# Patient Record
Sex: Male | Born: 1950 | Race: Black or African American | Hispanic: No | Marital: Married | Smoking: Current every day smoker
Health system: Southern US, Community
[De-identification: ages and names within clinical notes are randomized; demographics above are authoritative.]

## PROBLEM LIST (undated history)

## (undated) DIAGNOSIS — I639 Cerebral infarction, unspecified: Secondary | ICD-10-CM

## (undated) DIAGNOSIS — K861 Other chronic pancreatitis: Secondary | ICD-10-CM

## (undated) DIAGNOSIS — I10 Essential (primary) hypertension: Secondary | ICD-10-CM

## (undated) DIAGNOSIS — F0390 Unspecified dementia without behavioral disturbance: Secondary | ICD-10-CM

## (undated) DIAGNOSIS — R911 Solitary pulmonary nodule: Secondary | ICD-10-CM

## (undated) DIAGNOSIS — G40909 Epilepsy, unspecified, not intractable, without status epilepticus: Secondary | ICD-10-CM

## (undated) DIAGNOSIS — K2210 Ulcer of esophagus without bleeding: Secondary | ICD-10-CM

## (undated) DIAGNOSIS — F1010 Alcohol abuse, uncomplicated: Secondary | ICD-10-CM

## (undated) DIAGNOSIS — K219 Gastro-esophageal reflux disease without esophagitis: Secondary | ICD-10-CM

## (undated) HISTORY — DX: Alcohol abuse, uncomplicated: F10.10

## (undated) HISTORY — PX: NO PAST SURGERIES: SHX2092

## (undated) HISTORY — DX: Cerebral infarction, unspecified: I63.9

## (undated) HISTORY — DX: Gastro-esophageal reflux disease without esophagitis: K21.9

## (undated) HISTORY — DX: Epilepsy, unspecified, not intractable, without status epilepticus: G40.909

## (undated) HISTORY — DX: Unspecified dementia, unspecified severity, without behavioral disturbance, psychotic disturbance, mood disturbance, and anxiety: F03.90

## (undated) HISTORY — PX: OTHER SURGICAL HISTORY: SHX169

## (undated) HISTORY — DX: Essential (primary) hypertension: I10

---

## 1999-03-28 DIAGNOSIS — G40909 Epilepsy, unspecified, not intractable, without status epilepticus: Secondary | ICD-10-CM

## 1999-03-28 HISTORY — DX: Epilepsy, unspecified, not intractable, without status epilepticus: G40.909

## 1999-08-01 ENCOUNTER — Inpatient Hospital Stay (HOSPITAL_COMMUNITY): Admission: EM | Admit: 1999-08-01 | Discharge: 1999-08-16 | Payer: Self-pay | Admitting: Emergency Medicine

## 1999-08-02 ENCOUNTER — Encounter: Payer: Self-pay | Admitting: Neurosurgery

## 1999-08-04 ENCOUNTER — Encounter: Payer: Self-pay | Admitting: Neurosurgery

## 2003-11-14 ENCOUNTER — Other Ambulatory Visit: Payer: Self-pay

## 2004-03-05 ENCOUNTER — Emergency Department: Payer: Self-pay | Admitting: General Practice

## 2006-01-12 ENCOUNTER — Emergency Department: Payer: Self-pay | Admitting: Emergency Medicine

## 2007-03-15 ENCOUNTER — Emergency Department (HOSPITAL_COMMUNITY): Admission: EM | Admit: 2007-03-15 | Discharge: 2007-03-15 | Payer: Self-pay | Admitting: Emergency Medicine

## 2008-08-21 ENCOUNTER — Inpatient Hospital Stay (HOSPITAL_COMMUNITY): Admission: EM | Admit: 2008-08-21 | Discharge: 2008-08-26 | Payer: Self-pay | Admitting: Emergency Medicine

## 2008-09-05 ENCOUNTER — Inpatient Hospital Stay (HOSPITAL_COMMUNITY): Admission: EM | Admit: 2008-09-05 | Discharge: 2008-09-11 | Payer: Self-pay | Admitting: Internal Medicine

## 2008-09-05 ENCOUNTER — Emergency Department: Payer: Self-pay | Admitting: Emergency Medicine

## 2009-04-07 ENCOUNTER — Inpatient Hospital Stay (HOSPITAL_COMMUNITY): Admission: EM | Admit: 2009-04-07 | Discharge: 2009-04-15 | Payer: Self-pay | Admitting: Emergency Medicine

## 2009-07-18 ENCOUNTER — Observation Stay (HOSPITAL_COMMUNITY): Admission: EM | Admit: 2009-07-18 | Discharge: 2009-07-20 | Payer: Self-pay | Admitting: Emergency Medicine

## 2009-07-18 ENCOUNTER — Ambulatory Visit: Payer: Self-pay | Admitting: Family Medicine

## 2009-07-28 ENCOUNTER — Ambulatory Visit (HOSPITAL_COMMUNITY): Admission: RE | Admit: 2009-07-28 | Discharge: 2009-07-28 | Payer: Self-pay | Admitting: Internal Medicine

## 2009-09-04 ENCOUNTER — Observation Stay (HOSPITAL_COMMUNITY): Admission: EM | Admit: 2009-09-04 | Discharge: 2009-09-05 | Payer: Self-pay | Admitting: Emergency Medicine

## 2009-09-14 ENCOUNTER — Emergency Department (HOSPITAL_COMMUNITY): Admission: EM | Admit: 2009-09-14 | Discharge: 2009-09-14 | Payer: Self-pay | Admitting: Emergency Medicine

## 2009-10-18 ENCOUNTER — Emergency Department (HOSPITAL_COMMUNITY): Admission: EM | Admit: 2009-10-18 | Discharge: 2009-10-19 | Payer: Self-pay | Admitting: Emergency Medicine

## 2010-04-26 ENCOUNTER — Inpatient Hospital Stay (HOSPITAL_COMMUNITY)
Admission: EM | Admit: 2010-04-26 | Discharge: 2010-05-16 | DRG: 101 | Disposition: A | Discharge status: Skilled Nursing Facility | Payer: Medicare Other | Attending: Internal Medicine | Admitting: Internal Medicine

## 2010-04-26 DIAGNOSIS — F1020 Alcohol dependence, uncomplicated: Secondary | ICD-10-CM | POA: Diagnosis present

## 2010-04-26 DIAGNOSIS — K861 Other chronic pancreatitis: Secondary | ICD-10-CM | POA: Diagnosis present

## 2010-04-26 DIAGNOSIS — K709 Alcoholic liver disease, unspecified: Secondary | ICD-10-CM | POA: Diagnosis present

## 2010-04-26 DIAGNOSIS — F17200 Nicotine dependence, unspecified, uncomplicated: Secondary | ICD-10-CM | POA: Diagnosis present

## 2010-04-26 DIAGNOSIS — N179 Acute kidney failure, unspecified: Secondary | ICD-10-CM | POA: Diagnosis present

## 2010-04-26 DIAGNOSIS — R5381 Other malaise: Secondary | ICD-10-CM | POA: Diagnosis present

## 2010-04-26 DIAGNOSIS — K449 Diaphragmatic hernia without obstruction or gangrene: Secondary | ICD-10-CM | POA: Diagnosis present

## 2010-04-26 DIAGNOSIS — E876 Hypokalemia: Secondary | ICD-10-CM | POA: Diagnosis present

## 2010-04-26 DIAGNOSIS — E119 Type 2 diabetes mellitus without complications: Secondary | ICD-10-CM | POA: Diagnosis present

## 2010-04-26 DIAGNOSIS — Z9119 Patient's noncompliance with other medical treatment and regimen: Secondary | ICD-10-CM

## 2010-04-26 DIAGNOSIS — K2100 Gastro-esophageal reflux disease with esophagitis, without bleeding: Secondary | ICD-10-CM | POA: Diagnosis present

## 2010-04-26 DIAGNOSIS — R112 Nausea with vomiting, unspecified: Secondary | ICD-10-CM | POA: Diagnosis present

## 2010-04-26 DIAGNOSIS — F10939 Alcohol use, unspecified with withdrawal, unspecified: Secondary | ICD-10-CM | POA: Diagnosis present

## 2010-04-26 DIAGNOSIS — I69991 Dysphagia following unspecified cerebrovascular disease: Secondary | ICD-10-CM

## 2010-04-26 DIAGNOSIS — K2970 Gastritis, unspecified, without bleeding: Secondary | ICD-10-CM | POA: Diagnosis present

## 2010-04-26 DIAGNOSIS — F10239 Alcohol dependence with withdrawal, unspecified: Secondary | ICD-10-CM | POA: Diagnosis present

## 2010-04-26 DIAGNOSIS — R195 Other fecal abnormalities: Secondary | ICD-10-CM | POA: Diagnosis present

## 2010-04-26 DIAGNOSIS — I10 Essential (primary) hypertension: Secondary | ICD-10-CM | POA: Diagnosis present

## 2010-04-26 DIAGNOSIS — K560 Paralytic ileus: Secondary | ICD-10-CM | POA: Diagnosis present

## 2010-04-26 DIAGNOSIS — R1110 Vomiting, unspecified: Secondary | ICD-10-CM

## 2010-04-26 DIAGNOSIS — R911 Solitary pulmonary nodule: Secondary | ICD-10-CM | POA: Diagnosis present

## 2010-04-26 DIAGNOSIS — F1027 Alcohol dependence with alcohol-induced persisting dementia: Secondary | ICD-10-CM | POA: Diagnosis present

## 2010-04-26 DIAGNOSIS — K269 Duodenal ulcer, unspecified as acute or chronic, without hemorrhage or perforation: Secondary | ICD-10-CM | POA: Diagnosis present

## 2010-04-26 DIAGNOSIS — Z91199 Patient's noncompliance with other medical treatment and regimen due to unspecified reason: Secondary | ICD-10-CM

## 2010-04-26 DIAGNOSIS — R1312 Dysphagia, oropharyngeal phase: Secondary | ICD-10-CM | POA: Diagnosis present

## 2010-04-26 DIAGNOSIS — G40909 Epilepsy, unspecified, not intractable, without status epilepticus: Principal | ICD-10-CM | POA: Diagnosis present

## 2010-04-26 LAB — COMPREHENSIVE METABOLIC PANEL
ALT: 19 U/L (ref 0–53)
AST: 18 U/L (ref 0–37)
AST: 18 U/L (ref 0–37)
Albumin: 3.6 g/dL (ref 3.5–5.2)
Alkaline Phosphatase: 70 U/L (ref 39–117)
CO2: 24 mEq/L (ref 19–32)
Calcium: 8.6 mg/dL (ref 8.4–10.5)
Calcium: 8.5 mg/dL (ref 8.4–10.5)
Chloride: 107 mEq/L (ref 96–112)
GFR calc Af Amer: >60 mL/min (ref >60)
GFR calc Af Amer: >60 mL/min (ref >60)
GFR calc non Af Amer: >60 mL/min (ref >60)
Glucose, Bld: 187 mg/dL — ABNORMAL HIGH (ref 70–99)
Glucose, Bld: 150 mg/dL — ABNORMAL HIGH (ref 70–99)
Potassium: 3.5 mEq/L (ref 3.5–5.1)
Potassium: 3.4 mEq/L — ABNORMAL LOW (ref 3.5–5.1)
Sodium: 139 mEq/L (ref 135–145)
Sodium: 138 mEq/L (ref 135–145)
Total Protein: 6.8 g/dL (ref 6.0–8.3)
Total Protein: 6.6 g/dL (ref 6.0–8.3)

## 2010-04-26 LAB — POCT I-STAT, CHEM 8
BUN: 18 mg/dL (ref 6–23)
Calcium, Ion: 1.13 mmol/L (ref 1.12–1.32)
Glucose, Bld: 188 mg/dL — ABNORMAL HIGH (ref 70–99)
Potassium: 3.7 mEq/L (ref 3.5–5.1)
Sodium: 140 mEq/L (ref 135–145)

## 2010-04-26 LAB — DIFFERENTIAL
Basophils Absolute: 0 10*3/uL (ref 0.0–0.1)
Basophils Relative: 0 % (ref 0–1)
Basophils Relative: 0 % (ref 0–1)
Eosinophils Absolute: 0 10*3/uL (ref 0.0–0.7)
Eosinophils Absolute: 0 10*3/uL (ref 0.0–0.7)
Eosinophils Relative: 0 % (ref 0–5)
Lymphocytes Relative: 7 % — ABNORMAL LOW (ref 12–46)
Lymphs Abs: 1.4 10*3/uL (ref 0.7–4.0)
Monocytes Absolute: 0.3 10*3/uL (ref 0.1–1.0)
Monocytes Absolute: 0.8 10*3/uL (ref 0.1–1.0)
Monocytes Relative: 9 % (ref 3–12)

## 2010-04-26 LAB — CBC
HCT: 42.4 % (ref 39.0–52.0)
Hemoglobin: 14.2 g/dL (ref 13.0–17.0)
MCH: 29.6 pg (ref 26.0–34.0)
MCHC: 34.5 g/dL (ref 30.0–36.0)
MCV: 85.8 fL (ref 78.0–100.0)
Platelets: 125 10*3/uL — ABNORMAL LOW (ref 150–400)
Platelets: 145 10*3/uL — ABNORMAL LOW (ref 150–400)
RBC: 4.91 MIL/uL (ref 4.22–5.81)
RBC: 4.59 MIL/uL (ref 4.22–5.81)
RDW: 14.2 % (ref 11.5–15.5)
RDW: 14.1 % (ref 11.5–15.5)
WBC: 7.3 10*3/uL (ref 4.0–10.5)

## 2010-04-26 LAB — RAPID URINE DRUG SCREEN, HOSP PERFORMED: Benzodiazepines: POSITIVE — AB

## 2010-04-26 LAB — MRSA PCR SCREENING: MRSA by PCR: INVALID — AB

## 2010-04-26 LAB — GLUCOSE, CAPILLARY: Glucose-Capillary: 132 mg/dL — ABNORMAL HIGH (ref 70–99)

## 2010-04-26 LAB — PHOSPHORUS: Phosphorus: 2 mg/dL — ABNORMAL LOW (ref 2.3–4.6)

## 2010-04-27 LAB — BASIC METABOLIC PANEL
BUN: 8 mg/dL (ref 6–23)
Chloride: 106 mEq/L (ref 96–112)
Creatinine, Ser: 0.92 mg/dL (ref 0.4–1.5)
GFR calc non Af Amer: >60 mL/min (ref >60)
Glucose, Bld: 152 mg/dL — ABNORMAL HIGH (ref 70–99)
Potassium: 3.4 mEq/L — ABNORMAL LOW (ref 3.5–5.1)

## 2010-04-27 LAB — GLUCOSE, CAPILLARY
Glucose-Capillary: 136 mg/dL — ABNORMAL HIGH (ref 70–99)
Glucose-Capillary: 131 mg/dL — ABNORMAL HIGH (ref 70–99)
Glucose-Capillary: 112 mg/dL — ABNORMAL HIGH (ref 70–99)

## 2010-04-27 LAB — CBC
HCT: 39.9 % (ref 39.0–52.0)
MCH: 28.4 pg (ref 26.0–34.0)
MCV: 86.4 fL (ref 78.0–100.0)
RDW: 14.1 % (ref 11.5–15.5)
WBC: 7 10*3/uL (ref 4.0–10.5)

## 2010-04-27 LAB — HEMOGLOBIN A1C: Hgb A1c MFr Bld: 6.7 % — ABNORMAL HIGH (ref <5.7)

## 2010-04-27 LAB — VALPROIC ACID LEVEL: Valproic Acid Lvl: <1 ug/mL — ABNORMAL LOW (ref 50.0–100.0)

## 2010-04-28 LAB — BASIC METABOLIC PANEL
BUN: 8 mg/dL (ref 6–23)
CO2: 25 mEq/L (ref 19–32)
Chloride: 101 mEq/L (ref 96–112)
Glucose, Bld: 149 mg/dL — ABNORMAL HIGH (ref 70–99)
Potassium: 3.4 mEq/L — ABNORMAL LOW (ref 3.5–5.1)

## 2010-04-28 LAB — GLUCOSE, CAPILLARY
Glucose-Capillary: 140 mg/dL — ABNORMAL HIGH (ref 70–99)
Glucose-Capillary: 184 mg/dL — ABNORMAL HIGH (ref 70–99)

## 2010-04-28 LAB — CBC
HCT: 40.3 % (ref 39.0–52.0)
MCV: 85.4 fL (ref 78.0–100.0)
RBC: 4.72 MIL/uL (ref 4.22–5.81)
WBC: 6.9 10*3/uL (ref 4.0–10.5)

## 2010-04-29 LAB — GLUCOSE, CAPILLARY
Glucose-Capillary: 141 mg/dL — ABNORMAL HIGH (ref 70–99)
Glucose-Capillary: 133 mg/dL — ABNORMAL HIGH (ref 70–99)

## 2010-04-29 LAB — MRSA CULTURE

## 2010-04-30 LAB — CBC
MCH: 28.9 pg (ref 26.0–34.0)
MCHC: 33.2 g/dL (ref 30.0–36.0)
MCV: 87 fL (ref 78.0–100.0)
Platelets: 129 10*3/uL — ABNORMAL LOW (ref 150–400)
RDW: 14.5 % (ref 11.5–15.5)
WBC: 7.8 10*3/uL (ref 4.0–10.5)

## 2010-04-30 LAB — GLUCOSE, CAPILLARY: Glucose-Capillary: 157 mg/dL — ABNORMAL HIGH (ref 70–99)

## 2010-04-30 LAB — BASIC METABOLIC PANEL
BUN: 15 mg/dL (ref 6–23)
Calcium: 8.2 mg/dL — ABNORMAL LOW (ref 8.4–10.5)
Creatinine, Ser: 0.88 mg/dL (ref 0.4–1.5)
GFR calc non Af Amer: >60 mL/min (ref >60)
Potassium: 3.6 mEq/L (ref 3.5–5.1)

## 2010-04-30 LAB — LIPASE, BLOOD: Lipase: 32 U/L (ref 11–59)

## 2010-05-01 ENCOUNTER — Inpatient Hospital Stay (HOSPITAL_COMMUNITY): Payer: Medicare Other

## 2010-05-01 LAB — GLUCOSE, CAPILLARY: Glucose-Capillary: 174 mg/dL — ABNORMAL HIGH (ref 70–99)

## 2010-05-01 LAB — URINALYSIS, ROUTINE W REFLEX MICROSCOPIC
Leukocytes, UA: NEGATIVE
Specific Gravity, Urine: >1.046 — ABNORMAL HIGH (ref 1.005–1.030)
Urine Glucose, Fasting: NEGATIVE mg/dL

## 2010-05-01 LAB — URINE MICROSCOPIC-ADD ON

## 2010-05-01 MED ORDER — IOHEXOL 300 MG/ML  SOLN: 125.0000 mL | Freq: Once | INTRAMUSCULAR | Status: AC | PRN

## 2010-05-02 LAB — HEPATIC FUNCTION PANEL
AST: 17 U/L (ref 0–37)
Albumin: 3.4 g/dL — ABNORMAL LOW (ref 3.5–5.2)
Total Bilirubin: 0.7 mg/dL (ref 0.3–1.2)
Total Protein: 6.5 g/dL (ref 6.0–8.3)

## 2010-05-02 LAB — CBC
Hemoglobin: 13.6 g/dL (ref 13.0–17.0)
MCHC: 33.3 g/dL (ref 30.0–36.0)
Platelets: 153 10*3/uL (ref 150–400)
RBC: 4.67 MIL/uL (ref 4.22–5.81)

## 2010-05-02 LAB — GLUCOSE, CAPILLARY: Glucose-Capillary: 125 mg/dL — ABNORMAL HIGH (ref 70–99)

## 2010-05-02 LAB — BASIC METABOLIC PANEL
CO2: 19 mEq/L (ref 19–32)
Calcium: 8.8 mg/dL (ref 8.4–10.5)
Chloride: 113 mEq/L — ABNORMAL HIGH (ref 96–112)
Creatinine, Ser: 0.73 mg/dL (ref 0.4–1.5)
GFR calc Af Amer: >60 mL/min (ref >60)
Sodium: 140 mEq/L (ref 135–145)

## 2010-05-03 ENCOUNTER — Inpatient Hospital Stay (HOSPITAL_COMMUNITY): Payer: Medicare Other

## 2010-05-03 LAB — BASIC METABOLIC PANEL
BUN: 12 mg/dL (ref 6–23)
CO2: 22 mEq/L (ref 19–32)
Chloride: 107 mEq/L (ref 96–112)
Glucose, Bld: 135 mg/dL — ABNORMAL HIGH (ref 70–99)
Potassium: 3.6 mEq/L (ref 3.5–5.1)

## 2010-05-03 LAB — CULTURE, BLOOD (ROUTINE X 2)
Culture  Setup Time: 201202010041
Culture: NO GROWTH

## 2010-05-03 LAB — GLUCOSE, CAPILLARY
Glucose-Capillary: 137 mg/dL — ABNORMAL HIGH (ref 70–99)
Glucose-Capillary: 131 mg/dL — ABNORMAL HIGH (ref 70–99)

## 2010-05-03 LAB — URINE CULTURE: Colony Count: 55000

## 2010-05-03 LAB — CBC
HCT: 40.6 % (ref 39.0–52.0)
Hemoglobin: 13.8 g/dL (ref 13.0–17.0)
MCV: 86.2 fL (ref 78.0–100.0)
RBC: 4.71 MIL/uL (ref 4.22–5.81)
RDW: 14.1 % (ref 11.5–15.5)
WBC: 6.7 10*3/uL (ref 4.0–10.5)

## 2010-05-04 ENCOUNTER — Inpatient Hospital Stay (HOSPITAL_COMMUNITY): Payer: Medicare Other

## 2010-05-04 LAB — HEPATIC FUNCTION PANEL
ALT: 24 U/L (ref 0–53)
Albumin: 3.7 g/dL (ref 3.5–5.2)
Alkaline Phosphatase: 77 U/L (ref 39–117)
Total Protein: 7.2 g/dL (ref 6.0–8.3)

## 2010-05-04 LAB — BASIC METABOLIC PANEL
BUN: 23 mg/dL (ref 6–23)
CO2: 21 mEq/L (ref 19–32)
Chloride: 108 mEq/L (ref 96–112)
Creatinine, Ser: 0.83 mg/dL (ref 0.4–1.5)
Potassium: 3.9 mEq/L (ref 3.5–5.1)

## 2010-05-04 LAB — GLUCOSE, CAPILLARY
Glucose-Capillary: 143 mg/dL — ABNORMAL HIGH (ref 70–99)
Glucose-Capillary: 184 mg/dL — ABNORMAL HIGH (ref 70–99)
Glucose-Capillary: 164 mg/dL — ABNORMAL HIGH (ref 70–99)
Glucose-Capillary: 188 mg/dL — ABNORMAL HIGH (ref 70–99)

## 2010-05-05 ENCOUNTER — Inpatient Hospital Stay (HOSPITAL_COMMUNITY): Payer: Medicare Other

## 2010-05-05 LAB — GLUCOSE, CAPILLARY
Glucose-Capillary: 149 mg/dL — ABNORMAL HIGH (ref 70–99)
Glucose-Capillary: 153 mg/dL — ABNORMAL HIGH (ref 70–99)
Glucose-Capillary: 132 mg/dL — ABNORMAL HIGH (ref 70–99)
Glucose-Capillary: 200 mg/dL — ABNORMAL HIGH (ref 70–99)

## 2010-05-05 LAB — URINALYSIS, ROUTINE W REFLEX MICROSCOPIC
Nitrite: NEGATIVE
Protein, ur: 30 mg/dL — AB
Urine Glucose, Fasting: NEGATIVE mg/dL
Urobilinogen, UA: 0.2 mg/dL (ref 0.0–1.0)

## 2010-05-05 LAB — COMPREHENSIVE METABOLIC PANEL
ALT: 17 U/L (ref 0–53)
AST: 14 U/L (ref 0–37)
Alkaline Phosphatase: 69 U/L (ref 39–117)
CO2: 24 mEq/L (ref 19–32)
Calcium: 9.1 mg/dL (ref 8.4–10.5)
Chloride: 109 mEq/L (ref 96–112)
GFR calc Af Amer: >60 mL/min (ref >60)
GFR calc non Af Amer: >60 mL/min (ref >60)
Glucose, Bld: 152 mg/dL — ABNORMAL HIGH (ref 70–99)
Sodium: 145 mEq/L (ref 135–145)
Total Bilirubin: 0.8 mg/dL (ref 0.3–1.2)

## 2010-05-05 LAB — CBC
Hemoglobin: 15.3 g/dL (ref 13.0–17.0)
MCH: 29.4 pg (ref 26.0–34.0)
MCHC: 34 g/dL (ref 30.0–36.0)
Platelets: 269 10*3/uL (ref 150–400)
RDW: 14.5 % (ref 11.5–15.5)

## 2010-05-05 LAB — DIFFERENTIAL
Basophils Absolute: 0 10*3/uL (ref 0.0–0.1)
Basophils Relative: 0 % (ref 0–1)
Eosinophils Absolute: 0 10*3/uL (ref 0.0–0.7)
Lymphocytes Relative: 13 % (ref 12–46)
Lymphs Abs: 2.1 10*3/uL (ref 0.7–4.0)
Monocytes Absolute: 1.1 10*3/uL — ABNORMAL HIGH (ref 0.1–1.0)
Neutro Abs: 13.1 10*3/uL — ABNORMAL HIGH (ref 1.7–7.7)

## 2010-05-05 LAB — URINE MICROSCOPIC-ADD ON

## 2010-05-05 LAB — MAGNESIUM: Magnesium: 2.2 mg/dL (ref 1.5–2.5)

## 2010-05-06 LAB — CBC
Platelets: 249 10*3/uL (ref 150–400)
RBC: 5.05 MIL/uL (ref 4.22–5.81)
RDW: 14.7 % (ref 11.5–15.5)
WBC: 16.4 10*3/uL — ABNORMAL HIGH (ref 4.0–10.5)

## 2010-05-06 LAB — DIFFERENTIAL
Basophils Absolute: 0 10*3/uL (ref 0.0–0.1)
Basophils Relative: 0 % (ref 0–1)
Eosinophils Absolute: 0 10*3/uL (ref 0.0–0.7)
Eosinophils Relative: 0 % (ref 0–5)
Neutrophils Relative %: 80 % — ABNORMAL HIGH (ref 43–77)

## 2010-05-06 LAB — GLUCOSE, CAPILLARY
Glucose-Capillary: 162 mg/dL — ABNORMAL HIGH (ref 70–99)
Glucose-Capillary: 176 mg/dL — ABNORMAL HIGH (ref 70–99)
Glucose-Capillary: 160 mg/dL — ABNORMAL HIGH (ref 70–99)

## 2010-05-06 LAB — URINE CULTURE
Colony Count: NO GROWTH
Culture  Setup Time: 201202091417
Culture: NO GROWTH
Special Requests: NEGATIVE

## 2010-05-06 LAB — LIPASE, BLOOD: Lipase: 38 U/L (ref 11–59)

## 2010-05-06 LAB — COMPREHENSIVE METABOLIC PANEL
AST: 13 U/L (ref 0–37)
Albumin: 3.4 g/dL — ABNORMAL LOW (ref 3.5–5.2)
Alkaline Phosphatase: 70 U/L (ref 39–117)
BUN: 23 mg/dL (ref 6–23)
Chloride: 113 mEq/L — ABNORMAL HIGH (ref 96–112)
GFR calc Af Amer: >60 mL/min (ref >60)
Potassium: 3.6 mEq/L (ref 3.5–5.1)
Total Bilirubin: 0.4 mg/dL (ref 0.3–1.2)
Total Protein: 6.5 g/dL (ref 6.0–8.3)

## 2010-05-06 LAB — AMYLASE: Amylase: 37 U/L (ref 0–105)

## 2010-05-07 LAB — GLUCOSE, CAPILLARY
Glucose-Capillary: 136 mg/dL — ABNORMAL HIGH (ref 70–99)
Glucose-Capillary: 143 mg/dL — ABNORMAL HIGH (ref 70–99)
Glucose-Capillary: 144 mg/dL — ABNORMAL HIGH (ref 70–99)
Glucose-Capillary: 144 mg/dL — ABNORMAL HIGH (ref 70–99)

## 2010-05-07 LAB — COMPREHENSIVE METABOLIC PANEL
CO2: 25 mEq/L (ref 19–32)
Calcium: 8.7 mg/dL (ref 8.4–10.5)
Creatinine, Ser: 0.85 mg/dL (ref 0.4–1.5)
GFR calc Af Amer: >60 mL/min (ref >60)
GFR calc non Af Amer: >60 mL/min (ref >60)
Glucose, Bld: 152 mg/dL — ABNORMAL HIGH (ref 70–99)
Total Protein: 5.7 g/dL — ABNORMAL LOW (ref 6.0–8.3)

## 2010-05-07 LAB — CBC
HCT: 39 % (ref 39.0–52.0)
Hemoglobin: 13 g/dL (ref 13.0–17.0)
MCH: 29.3 pg (ref 26.0–34.0)
MCHC: 33.3 g/dL (ref 30.0–36.0)
RDW: 14.7 % (ref 11.5–15.5)

## 2010-05-07 LAB — DIFFERENTIAL
Basophils Relative: 0 % (ref 0–1)
Eosinophils Relative: 1 % (ref 0–5)
Monocytes Absolute: 1.1 10*3/uL — ABNORMAL HIGH (ref 0.1–1.0)
Monocytes Relative: 10 % (ref 3–12)
Neutro Abs: 9.1 10*3/uL — ABNORMAL HIGH (ref 1.7–7.7)

## 2010-05-07 LAB — MAGNESIUM: Magnesium: 2.1 mg/dL (ref 1.5–2.5)

## 2010-05-07 LAB — VALPROIC ACID LEVEL: Valproic Acid Lvl: <10 ug/mL — ABNORMAL LOW (ref 50.0–100.0)

## 2010-05-07 NOTE — Discharge Summary (Signed)
  NAME:  [name] [name], TIFT             ACCOUNT NO.:  [id number]  MEDICAL RECORD NO.:  [id number]           PATIENT TYPE:  I  LOCATION:  1240                         FACILITY:  [facility]  PHYSICIAN:  Talmage Nap, MD  DATE OF BIRTH:  [birth year]  DATE OF ADMISSION:  04/26/2010 DATE OF DISCHARGE:                        DISCHARGE SUMMARY - REFERRING   DATE OF DISCHARGE:  To be determined by the rounding physician.  The patient was however seen by me between May 04, 2010 to May 07, 2010.  CONSULTATIONS:  Joycelyn Schmid, M.D., with [facility]  DISCHARGE DIAGNOSES: 1. Recurrent seizures. 2. Leukocytosis - no known source, the patient empirically treated     with intravenous Rocephin. 3. History of cerebrovascular accident. 4. Dysphagia.  The patient tolerating dysphagia diet. 5. Hypertension. 6. History of chronic pancreatitis. 7. History of chronic alcohol use. 8. Alcoholic dementia. 9. Gastroesophageal reflux disease. 10.History of alcohol liver disease. 11.Noncompliant with medication. 12.Deconditioning.  For full hospital course on this patient from admission up to May 03, 2009, refer to the discharge summary dictated by Dr. Lawrence Santiago. The patient was however seen by me from May 04, 2010 to May 07, 2010, and during this encounter, the patient continued to have repeated seizures and subsequently Depakote as well as IV Dilantin was added to the patient's regimen and thereafter, the seizure was under control.  At the same time, the patient also had uncontrolled blood pressure, and clonidine, hydralazine, and Lopressor were added to the patient's regimen, and so far, the patient's blood pressure has remained fairly controlled.  So far, the patient has remained clinically stable. He was evaluated by me today, complained about mild pain in the epigastric region as well as soreness of the throat.  PHYSICAL EXAMINATION:  GENERAL:  Examination of  the patient was essentially unremarkable. VITAL SIGNS:  Blood pressure is 175/89, pulse is 84, and respiratory rate is 24.  Medically stable.  PERTINENT LABS:  Comprehensive metabolic panel done on May 07, 2010, showed sodium of 144, potassium of 3.4, chloride of 112, bicarb is 25, glucose is 152, BUN is 16, and creatinine 0.85.  Magnesium level is 2.1.  Complete blood count with differential showed WBC of 11.8, hemoglobin of 13.0, hematocrit 39.0, MCV 88.0 with a platelet count of 203,000; differential normal.  Keppra level 8.4.  Urine culture negative.  Blood culture negative.  Lipase 38 and normal.  Amylase 37 and normal.  PLAN:  For the patient to be transferred to general medical floor for evaluation by physical therapy for gradual ambulation.  Ultimately, the patient will need rehab.May need egd if epigastric pain persist. Time of discharge of this patient will be determined by the rounding physician.     Talmage Nap, MD     CN/MEDQ  D:  05/07/2010  T:  05/07/2010  Job:  [id number]  Electronically Signed by Talmage Nap  on 05/07/2010 03:19:47 PM

## 2010-05-08 LAB — DIFFERENTIAL
Eosinophils Absolute: 0.1 10*3/uL (ref 0.0–0.7)
Lymphocytes Relative: 16 % (ref 12–46)
Lymphs Abs: 1.3 10*3/uL (ref 0.7–4.0)
Neutro Abs: 6.5 10*3/uL (ref 1.7–7.7)
Neutrophils Relative %: 78 % — ABNORMAL HIGH (ref 43–77)

## 2010-05-08 LAB — MAGNESIUM: Magnesium: 1.7 mg/dL (ref 1.5–2.5)

## 2010-05-08 LAB — CBC
HCT: 37.5 % — ABNORMAL LOW (ref 39.0–52.0)
Hemoglobin: 12.9 g/dL — ABNORMAL LOW (ref 13.0–17.0)
MCV: 86.2 fL (ref 78.0–100.0)
Platelets: 190 10*3/uL (ref 150–400)
RBC: 4.35 MIL/uL (ref 4.22–5.81)
WBC: 8.4 10*3/uL (ref 4.0–10.5)

## 2010-05-08 LAB — COMPREHENSIVE METABOLIC PANEL
ALT: 17 U/L (ref 0–53)
AST: 14 U/L (ref 0–37)
Albumin: 2.8 g/dL — ABNORMAL LOW (ref 3.5–5.2)
BUN: 8 mg/dL (ref 6–23)
Chloride: 108 mEq/L (ref 96–112)
GFR calc non Af Amer: >60 mL/min (ref >60)
Potassium: 3.5 mEq/L (ref 3.5–5.1)

## 2010-05-08 LAB — GLUCOSE, CAPILLARY
Glucose-Capillary: 139 mg/dL — ABNORMAL HIGH (ref 70–99)
Glucose-Capillary: 134 mg/dL — ABNORMAL HIGH (ref 70–99)

## 2010-05-08 LAB — CLOSTRIDIUM DIFFICILE BY PCR: Toxigenic C. Difficile by PCR: NEGATIVE

## 2010-05-09 LAB — GLUCOSE, CAPILLARY
Glucose-Capillary: 133 mg/dL — ABNORMAL HIGH (ref 70–99)
Glucose-Capillary: 165 mg/dL — ABNORMAL HIGH (ref 70–99)
Glucose-Capillary: 127 mg/dL — ABNORMAL HIGH (ref 70–99)
Glucose-Capillary: 119 mg/dL — ABNORMAL HIGH (ref 70–99)

## 2010-05-10 LAB — GLUCOSE, CAPILLARY
Glucose-Capillary: 130 mg/dL — ABNORMAL HIGH (ref 70–99)
Glucose-Capillary: 134 mg/dL — ABNORMAL HIGH (ref 70–99)
Glucose-Capillary: 162 mg/dL — ABNORMAL HIGH (ref 70–99)
Glucose-Capillary: 181 mg/dL — ABNORMAL HIGH (ref 70–99)
Glucose-Capillary: 91 mg/dL (ref 70–99)

## 2010-05-10 LAB — CBC
HCT: 40.8 % (ref 39.0–52.0)
Hemoglobin: 13.9 g/dL (ref 13.0–17.0)
MCH: 29.6 pg (ref 26.0–34.0)
MCHC: 34.1 g/dL (ref 30.0–36.0)
MCV: 86.8 fL (ref 78.0–100.0)
RBC: 4.7 MIL/uL (ref 4.22–5.81)

## 2010-05-11 LAB — CULTURE, BLOOD (ROUTINE X 2)
Culture  Setup Time: 201202091101
Culture  Setup Time: 201202091101
Culture: NO GROWTH

## 2010-05-11 LAB — GLUCOSE, CAPILLARY
Glucose-Capillary: 128 mg/dL — ABNORMAL HIGH (ref 70–99)
Glucose-Capillary: 146 mg/dL — ABNORMAL HIGH (ref 70–99)

## 2010-05-11 NOTE — Discharge Summary (Signed)
NAME:  [name] [name], [name] [name]             ACCOUNT NO.:  [id number]  MEDICAL RECORD NO.:  [id number]           PATIENT TYPE:  I  LOCATION:  1414                         FACILITY:  [facility]  PHYSICIAN:  Zannie Cove, MD     DATE OF BIRTH:  [birth year]  DATE OF ADMISSION:  04/26/2010 DATE OF DISCHARGE:                              DISCHARGE SUMMARY   PRIMARY CARE PHYSICIAN:  None.  The patient is unassigned.  DISCHARGE DIAGNOSES: 1. Breakthrough seizures. 2. Alcohol withdrawal with delirium tremors. 3. Chronic pancreatitis with ileus. 4. History of seizure disorder. 5. Noncompliance with medications. 6. History of prior cerebrovascular accident. 7. History of heavy alcohol abuse. 8. Alcoholic liver disease. 9. Hypertension. 10.Dementia. 11.Gastroesophageal reflux disease. 12.Dysphagia.  CONSULTANTS:  Joycelyn Schmid, MD with [facility].  DIAGNOSTICS/INVESTIGATIONS: 1. CT of the head on April 26, 2010, no acute intracranial findings, premature atrophy, and small vessel disease, remote infarctions. 2. Chest x-ray on April 26, 2010, progressive cardiomegaly, interval     pulmonary vascular congestion. 3. CT abdomen and pelvis on May 01, 2010, showed no acute     findings, chronic calcific pancreatitis with peripancreatic     enlarged lymph nodes, presumably reactive mild small bowel     dilatation proximally, favor to represent adynamic ileus, no     evidence of transition point to suggest obstruction, bladder wall     thickening/irregularity.  Lung base nodules up to 4 mm.  If the     patient is at risk for bronchogenic CA, a followup chest x-ray in 1     year is recommended. 4. KUB on April 02, 2010, persistent but improving ileus. 5. Esophagram limited study due to delay in initiation swallowing,     mild dyskinesis.  No other significant pathology.  HOSPITAL COURSE: 1. [name] [name] is a 60 year old gentleman with history of known CVA,     seizure disorder as  well as alcohol abuse, presented to the     hospital with several breakthrough seizures on the morning of     admission.  The patient had been drinking heavily with his friends     and had been noncompliant with his medication, hence his     breakthrough seizures was felt to be secondary to noncompliance     with seizure medications as well as alcohol withdrawal.  He was     watched closely in the ICU, restarted on his home medications,     primarily Keppra and has been seizure free since then. 2. Alcohol withdrawal.  He subsequently went to delirium tremors and     alcohol withdrawal with encephalopathy which has since improved. 3. Abdominal pain, nausea, vomiting.  The patient has had issues with     abdominal pain, vomiting, and nausea intermittently and occasional     diarrhea.  He was ruled out for Clostridium difficile.  He had a CT     of abdomen and pelvis which showed chronic pancreatitis with ileus     which we suspected the cause of his abdominal pain and nausea, is     currently on clear liquid diet to be done as  tolerated with     continued supportive care, gentle hydration, etc.  The patient is     able to tolerate p.o., full liquids, or mechanical soft diet.     Probably in a day or so, he could possibly be discharged. 4. Alcoholic liver disease, stable. 5. Type 2 diabetes. 6. Dysphagia, felt to be from prior CVA.  Barium swallow this hospital     stay which confirmed dyskinesis with no significant other pathology     identified.  He is also felt to have component of GERD and     maintained on PPI for this. 7. Dementia, stable.  Continued on his Aricept and Namenda.  DISCHARGE MEDICATIONS:  Followup, etc. to be dictated at the time of actual discharge.     Zannie Cove, MD     PJ/MEDQ  D:  05/03/2010  T:  05/04/2010  Job:  [id number]  Electronically Signed by Zannie Cove  on 05/11/2010 02:19:24 PM

## 2010-05-11 NOTE — Discharge Summary (Signed)
NAME:  [name] [name], [name] [name]             ACCOUNT NO.:  [id number]  MEDICAL RECORD NO.:  [id number]           PATIENT TYPE:  I  LOCATION:  1508                         FACILITY:  [facility]  PHYSICIAN:  Charlestine Massed, MDDATE OF BIRTH:  1950-07-14  DATE OF ADMISSION:  04/26/2010 DATE OF DISCHARGE:                        DISCHARGE SUMMARY - REFERRING   PRIMARY CARE PHYSICIAN:  Unassigned.  Does not have a primary care physician.  Used to be Dr. Providence Crosby.  DISCHARGE DIAGNOSES: 1. Seizure disorder with breakthrough seizures, currently resolved. 2. Alcohol withdrawal with delirium tremens, currently resolved. 3. Chronic calcific pancreatitis with ileus with occasional episodes     of abdominal pain as expected in chronic pancreatitis. 4. Total noncompliance with medications. 5. Alcohol abuse. 6. Alcoholic liver disease - chronic. 7. Prior history of cerebrovascular accident. 8. Hypertension. 9. Mild dementia. 10.Gastroesophageal reflux disease. 11.Dysphagia - oropharyngeal.  Currently, he is tolerating dysphagia     specified diet. 12.Deconditioning with high fall risk. 13.Previous history of intracerebral hemorrhage.  DISCHARGE MEDICATIONS: 1. Zofran 4 mg p.o. q.6 h. p.r.n. for nausea. 2. Lisinopril 20 mg p.o. b.i.d. 3. Magnesium oxide 400 mg p.o. daily. 4. Maalox 30 mL q.6 h. p.r.n. for bloating. 5. Depakote 125 mg 1 capsule p.o. q.h.s. 6. Keppra 1500 mg p.o. b.i.d. 7. Imuran 15 mg p.o. q.h.s. 8. Effexor XR 75 mg p.o. daily. 9. Nicotine patch 21 mg to 7 mg taper as per prescribed - 21 mg patch     for 14 days and 14 mg patch for 14 days, and 7 mg patch for 21 days     and stop and avoid tobacco products totally. 10.Coreg 12.5 mg p.o. b.i.d. 11.Clonidine 0.2 mg p.o. q.8 h. 12.Namenda 5 mg p.o. b.i.d. 13.Pepcid 20 mg p.o. b.i.d. 14.Multivitamins 1 tablet daily. 15.Hydrocodone/APAP 1 tablet q.6 h. p.r.n. for pain, 30 tablets     dispensed. 16.Aricept 10 mg p.o.  q.h.s. 17.Systane solution 1 drop 4 times daily in both eyes p.r.n. for     dryness. 18.Folic acid 1 mg p.o. daily. 19.Thiamine 100 mg p.o. daily.  HOSPITAL COURSE: 1. CNS - seizure, alcohol withdrawal.  The patient was admitted on     January 31st with this issues of alcohol abuse with breakthrough     seizures.  The patient was found to be heavily having recurrent     seizures in the bed in the morning by the wife and was brought in.     He has been totally noncompliant with medications and had been     drinking heavily with his friends and the seizure symptoms are due     to alcohol, noncompliance to medication.  We restarted on the     medications and currently since then he has been totally seizure     free.  He had delirium tremors and was managed accordingly with     Ativan protocol.  Currently, he is more than 12 days since his last     drink and does not have any signs or symptoms of any alcohol     withdrawal at this time.  He will be currently going to skilled  nursing facility, can  follow up with outpatient alcohol rehab as     decided at that time. 2. Heme-positive stools.  The patient's condition was explained.  He     had repeated diarrhea multiple times, ruled out for Clostridium     difficile; however, the Hemoccult was positive and a stat CBC shows     no drop in hemoglobin.  However, in this current acute issues, I am     not following for any GI consult, but he will need GI consult as     outpatient.  Currently, we do not have any records of any     colonoscopy done to him so far. 3. We will refer him to Dr. Dulce Sellar of [facility] for     further care with regards to his GI workup. 4. Chronic calcific pancreatitis.  The patient has chronic calcific     pancreatitis as evidenced on CAT scan.  Does not have any signs of     acute pancreatitis at this time.  However, he will have bit of pain     once in a while due to his calcific pancreatitis and  explained to     him that it is to be expected and only pain management is the key     for him at this time. 5. Diabetes.  Previously noted as type 2 diabetes in previous     admissions.  Currently, his blood sugar is stable and he has not     required much of any insulin supplementation other than the times     when he was on IV dextrose drip.  We will watch him closely and can     be restarted on an insulin regimen once his acute issues are     stable.  I am not putting him on any regular ongoing insulin     regimen.  However, sliding scale can be followed at the skilled     nursing facility. 6. Liver disease.  This as mentioned earlier is chronic due to     alcoholic liver disease, currently stable and outpatient alcohol     rehab program needs to be pursued at the skilled nursing facility.  DISPOSITION: 1. Discharge to skilled nursing facility.  Followup, MD at nursing     facility to follow the patient. 2. Outpatient alcohol rehab to be set up. 3. Outpatient GI consult with Dr. Dulce Sellar of [facility] is     to be made at phone [phone].  SUBJECTIVE:  GENERAL:  The patient is examined today, not in any distress, had 2 loose bowel movements and they were black in color as per the patient.  They tested Hemoccult positive.  The patient is awake and is able to answer most of the questions well, but he speaks in a very slow tone, not in any distress. VITAL SIGNS:  Temperature 97.8, heart rate 71, respirations 18, blood pressure 136/87, and O2 saturation 98% on room air. HEAD AND NECK:  No JVD, no thrush, no bruits. CHEST:  Bilateral air entry is good anteriorly and posteriorly. CARDIAC:  S1 and S2 heard, regular. ABDOMEN:  Soft and nontender.  No organomegaly.  Bowel sounds positive. EXTREMITIES:  No pedal edema. CNS:  Gait abnormality present and he is weak and has to hold on to walk - possibly secondary to deconditioning.  LABORATORY DATA: 1. Stool Hemoccult done  today, negative. 2. Stat CBC done today, WBC 12.3, hemoglobin 13.9, hematocrit 40.8,  and platelets 269.  ASSESSMENT AND PLAN:  As dictated above.  DISPOSITION:  Maybe discharged to skilled nursing facility in 1 to 2 days where if stable.  CBC has been ordered for morning.     Charlestine Massed, MD     UT/MEDQ  D:  05/10/2010  T:  05/10/2010  Job:  [id number]  Electronically Signed by Charlestine Massed MD on 05/11/2010 03:14:39 PM

## 2010-05-12 ENCOUNTER — Inpatient Hospital Stay (HOSPITAL_COMMUNITY): Payer: Medicare Other

## 2010-05-12 DIAGNOSIS — R1084 Generalized abdominal pain: Secondary | ICD-10-CM

## 2010-05-12 DIAGNOSIS — K920 Hematemesis: Secondary | ICD-10-CM

## 2010-05-12 DIAGNOSIS — K861 Other chronic pancreatitis: Secondary | ICD-10-CM

## 2010-05-12 LAB — GLUCOSE, CAPILLARY: Glucose-Capillary: 136 mg/dL — ABNORMAL HIGH (ref 70–99)

## 2010-05-12 LAB — HEMOGLOBIN: Hemoglobin: 12.3 g/dL — ABNORMAL LOW (ref 13.0–17.0)

## 2010-05-12 LAB — COMPREHENSIVE METABOLIC PANEL
ALT: 20 U/L (ref 0–53)
AST: 14 U/L (ref 0–37)
CO2: 27 mEq/L (ref 19–32)
Calcium: 9 mg/dL (ref 8.4–10.5)
Chloride: 105 mEq/L (ref 96–112)
Creatinine, Ser: 1.08 mg/dL (ref 0.4–1.5)
GFR calc Af Amer: >60 mL/min (ref >60)
GFR calc non Af Amer: >60 mL/min (ref >60)
Glucose, Bld: 144 mg/dL — ABNORMAL HIGH (ref 70–99)
Sodium: 141 mEq/L (ref 135–145)
Total Bilirubin: 0.5 mg/dL (ref 0.3–1.2)

## 2010-05-12 LAB — LIPASE, BLOOD: Lipase: 29 U/L (ref 11–59)

## 2010-05-12 LAB — MAGNESIUM: Magnesium: 3 mg/dL — ABNORMAL HIGH (ref 1.5–2.5)

## 2010-05-12 LAB — HEMOGLOBIN AND HEMATOCRIT, BLOOD
HCT: 37.5 % — ABNORMAL LOW (ref 39.0–52.0)
Hemoglobin: 12.4 g/dL — ABNORMAL LOW (ref 13.0–17.0)

## 2010-05-12 LAB — PHOSPHORUS: Phosphorus: 1.7 mg/dL — ABNORMAL LOW (ref 2.3–4.6)

## 2010-05-12 LAB — CBC
HCT: 39.9 % (ref 39.0–52.0)
Hemoglobin: 13.3 g/dL (ref 13.0–17.0)
MCHC: 33.3 g/dL (ref 30.0–36.0)
MCV: 87.9 fL (ref 78.0–100.0)
RDW: 14.1 % (ref 11.5–15.5)

## 2010-05-13 ENCOUNTER — Encounter: Payer: Self-pay | Admitting: Internal Medicine

## 2010-05-13 ENCOUNTER — Inpatient Hospital Stay (HOSPITAL_COMMUNITY): Payer: Medicare Other

## 2010-05-13 DIAGNOSIS — K210 Gastro-esophageal reflux disease with esophagitis: Secondary | ICD-10-CM

## 2010-05-13 DIAGNOSIS — K2900 Acute gastritis without bleeding: Secondary | ICD-10-CM

## 2010-05-13 DIAGNOSIS — K263 Acute duodenal ulcer without hemorrhage or perforation: Secondary | ICD-10-CM

## 2010-05-13 LAB — CBC
HCT: 22.1 % — ABNORMAL LOW (ref 39.0–52.0)
Hemoglobin: 7.4 g/dL — ABNORMAL LOW (ref 13.0–17.0)
MCH: 29.1 pg (ref 26.0–34.0)
MCV: 88.6 fL (ref 78.0–100.0)
MCV: 89.5 fL (ref 78.0–100.0)
Platelets: 187 10*3/uL (ref 150–400)
Platelets: 272 10*3/uL (ref 150–400)
RBC: 2.47 MIL/uL — ABNORMAL LOW (ref 4.22–5.81)
RBC: 2.56 MIL/uL — ABNORMAL LOW (ref 4.22–5.81)
RDW: 14.3 % (ref 11.5–15.5)
WBC: 15.6 10*3/uL — ABNORMAL HIGH (ref 4.0–10.5)
WBC: 9.4 10*3/uL (ref 4.0–10.5)

## 2010-05-13 LAB — COMPREHENSIVE METABOLIC PANEL
Albumin: 2.7 g/dL — ABNORMAL LOW (ref 3.5–5.2)
BUN: 47 mg/dL — ABNORMAL HIGH (ref 6–23)
Creatinine, Ser: 1.47 mg/dL (ref 0.4–1.5)
Potassium: 4.2 mEq/L (ref 3.5–5.1)
Total Protein: 5.2 g/dL — ABNORMAL LOW (ref 6.0–8.3)

## 2010-05-13 LAB — URINALYSIS, ROUTINE W REFLEX MICROSCOPIC
Hgb urine dipstick: NEGATIVE
Protein, ur: NEGATIVE mg/dL
Urobilinogen, UA: 0.2 mg/dL (ref 0.0–1.0)

## 2010-05-13 LAB — GLUCOSE, CAPILLARY
Glucose-Capillary: 105 mg/dL — ABNORMAL HIGH (ref 70–99)
Glucose-Capillary: 140 mg/dL — ABNORMAL HIGH (ref 70–99)

## 2010-05-13 LAB — CLOSTRIDIUM DIFFICILE BY PCR: Toxigenic C. Difficile by PCR: NEGATIVE

## 2010-05-13 LAB — HEMOGLOBIN AND HEMATOCRIT, BLOOD: Hemoglobin: 8.2 g/dL — ABNORMAL LOW (ref 13.0–17.0)

## 2010-05-14 LAB — GLUCOSE, CAPILLARY: Glucose-Capillary: 130 mg/dL — ABNORMAL HIGH (ref 70–99)

## 2010-05-14 LAB — CBC
HCT: 24.9 % — ABNORMAL LOW (ref 39.0–52.0)
MCV: 88.3 fL (ref 78.0–100.0)
Platelets: 172 10*3/uL (ref 150–400)
RBC: 2.82 MIL/uL — ABNORMAL LOW (ref 4.22–5.81)
WBC: 10.5 10*3/uL (ref 4.0–10.5)

## 2010-05-14 LAB — URINE CULTURE
Colony Count: >=100000
Culture  Setup Time: 201202170944

## 2010-05-14 LAB — BASIC METABOLIC PANEL
BUN: 13 mg/dL (ref 6–23)
Chloride: 109 mEq/L (ref 96–112)
Potassium: 4.5 mEq/L (ref 3.5–5.1)

## 2010-05-14 LAB — CLOTEST (H. PYLORI), BIOPSY: Helicobacter screen: NEGATIVE

## 2010-05-15 LAB — GLUCOSE, CAPILLARY
Glucose-Capillary: 133 mg/dL — ABNORMAL HIGH (ref 70–99)
Glucose-Capillary: 140 mg/dL — ABNORMAL HIGH (ref 70–99)

## 2010-05-15 LAB — CBC
MCH: 28.7 pg (ref 26.0–34.0)
MCHC: 32.7 g/dL (ref 30.0–36.0)
Platelets: 223 10*3/uL (ref 150–400)
RBC: 3.34 MIL/uL — ABNORMAL LOW (ref 4.22–5.81)
RDW: 14.1 % (ref 11.5–15.5)

## 2010-05-15 LAB — CROSSMATCH
ABO/RH(D): B POS
Antibody Screen: NEGATIVE

## 2010-05-15 LAB — BASIC METABOLIC PANEL
BUN: 7 mg/dL (ref 6–23)
Calcium: 9 mg/dL (ref 8.4–10.5)
Creatinine, Ser: 0.75 mg/dL (ref 0.4–1.5)
GFR calc Af Amer: >60 mL/min (ref >60)

## 2010-05-16 LAB — GLUCOSE, CAPILLARY
Glucose-Capillary: 136 mg/dL — ABNORMAL HIGH (ref 70–99)
Glucose-Capillary: 135 mg/dL — ABNORMAL HIGH (ref 70–99)
Glucose-Capillary: 121 mg/dL — ABNORMAL HIGH (ref 70–99)

## 2010-05-18 LAB — CULTURE, BLOOD (ROUTINE X 2): Culture: NO GROWTH

## 2010-05-18 NOTE — Procedures (Signed)
Summary: Upper Endoscopy  Patient: [name] [name] Note: All result statuses are Final unless otherwise noted.  Tests: (1) Upper Endoscopy (EGD)   EGD Upper Endoscopy       DONE     [facility]     [address] [place], [place]  [address]           ENDOSCOPY PROCEDURE REPORT           PATIENT:  [name] [name], [name] [name]  MR#:  [id number]     BIRTHDATE:  1950-10-06, 59 yrs. old  GENDER:  male           ENDOSCOPIST:  Wilhemina Bonito. Eda Keys, MD     Referred by:  [facility],           PROCEDURE DATE:  05/13/2010     PROCEDURE:  EGD with biopsy, [id number]     ASA CLASS:  Class III     INDICATIONS:  hematemesis - coffee grounds           MEDICATIONS:   Fentanyl 50 mcg IV, Versed 4 mg     TOPICAL ANESTHETIC:  Cetacaine Spray           DESCRIPTION OF PROCEDURE:   After the risks benefits and     alternatives of the procedure were thoroughly explained, informed     consent was obtained.  The Pentax Gastroscope [id number] endoscope     was introduced through the mouth and advanced to the second     portion of the duodenum, without limitations.  The instrument was     slowly withdrawn as the mucosa was fully examined.     <<PROCEDUREIMAGES>>           Severe exudative Esophagitis was found in the total esophagus.     Moderate gastritis was found in the antrum.  Duodenitis was found     in the bulb of the duodenum.  A 4mm clean based ulcer was found in     the bulb of the duodenum. CLO bx taken.   Retroflexed views     revealed a hiatal hernia.    The scope was then withdrawn from the     patient and the procedure completed.           COMPLICATIONS:  None           ENDOSCOPIC IMPRESSION:     1) Severe exudative reflux Esophagitis in the total esophagus     2) Moderate gastritis in the antrum     3) Duodenitis in the bulb of duodenum     4) Small Ulcer in the bulb of duodenum     5) A hiatal hernia     6) Gerd     7) No active bleeding or high risk stigmata  RECOMMENDATIONS:     1) Anti-reflux regimen to be followed     2) PPI bid x 8 weeks then q day indefinitely     3) Rx CLO if positive     4) NO ALCOHOL           ______________________________     Wilhemina Bonito. Eda Keys, MD           CC:  The Patient; [facility] (Dr Eda Paschal)           n.     eSIGNED:   Wilhemina Bonito. Eda Keys at 05/13/2010 09:58 AM           Mason Jim,  Artie, [place] [id number]  Note: An exclamation mark (!) indicates a result that was not dispersed into the flowsheet. Document Creation Date: 05/13/2010 9:59 AM _______________________________________________________________________  (1) Order result status: Final Collection or observation date-time: 05/13/2010 09:48 Requested date-time:  Receipt date-time:  Reported date-time:  Referring Physician:   Ordering Physician: Fransico Setters [phone]) Specimen Source:  Source: Launa Grill Order Number: [id number] Lab site:

## 2010-05-25 NOTE — Discharge Summary (Signed)
NAME:  [name] [name]             ACCOUNT NO.:  [id number]  MEDICAL RECORD NO.:  [id number]           PATIENT TYPE:  I  LOCATION:  1508                         FACILITY:  [facility]  PHYSICIAN:  Lukus Binion I Eydan Chianese, MD      DATE OF BIRTH:  [birth year]  DATE OF ADMISSION:  04/26/2010 DATE OF DISCHARGE:                              DISCHARGE SUMMARY   DISCHARGE DIAGNOSES: 1. Seizure disorder with breakthrough seizure, currently resolved. 2. Alcohol withdrawal with delirium tremens, currently resolved. 3. Chronic calcific pancreatitis with ileus and occasional episode of     abdominal pain. 4. Alcohol abuse. 5. Alcoholic liver disease. 6. Acute blood loss anemia. 7. Severe esophagitis, exudative on the total esophagus, moderate     gastritis in the antrum, duodenitis, in addition to 4-mm clean-     based ulcer at the top of the jejunum with negative CLOtest. 8. History of cerebrovascular accident. 9. Hypertension. 10.Gastroesophageal reflux disease. 11.Deconditioning. 12.Leukocytosis, felt to be secondary to stress related. 13.Acute renal failure, resolved. 14.Bilateral renal lesions, did not show simple cyst.  This needs to     be further evaluation by MRI with and without contrast as     outpatient. 15.Lung based nodule up to 4 mm, recommend within 6 months to 1 year     to evaluate for overgrowth.  DISCHARGE MEDICATIONS: 1. Clonidine 0.1 mg twice daily. 2. Nicotine patch for 14 days, then 14 batch for 14 days and then 7     batch for 21 days.  Avoid tobacco product totally. 3. Protonix 40 mg p.o. b.i.d. 4. Creon 1 tab 3 times with meal and snack. 5. Norvasc 5 mg twice daily. 6. Zofran 4 mg every 6 hours as needed. 7. Aricept 10 mg daily at bedtime. 8. Depakote 125 mg daily at bedtime. 9. Folic acid 1 mg daily. 10.Hydrocodone/APAP 1 tab q.6 h as needed. 11.Keppra 1500 mg twice daily. 12.Magnesium oxide 400 mg daily. 13.Mirtazapine 15 mg at bedtime. 14.Multivitamin 1 tab  daily. 15.Namenda 5 mg p.o. twice daily. 16.Systane in both eyes as needed. 17.Thiamine 100 mg daily. 18.Venlafaxine XR 75 mg daily.  Will DC Mirtazapine 400 mg from outpatient medications.  HOSPITAL COURSE: 1. Seizure, alcohol withdrawal.  The patient was admitted on April 26, 2009 with this issue of alcohol abuse with breakthrough     seizures when the patient was found by his wife having recurring     seizure and has been totally noncompliant with his medication, has     been drinking heavily.  The patient was started on his medication     and he was seizure-free.  He had delirium tremens and was managed     accordingly with Ativan protocol.  He will go to Skilled Nursing     Facility and can follow up with outpatient alcohol rehab as decided     at this time. 2. Hypertension, being managed with the current medication and     stabilized. 3. Anemia with guaiac-positive stool.  The patient's hemoglobin     dropped to as low as 7.1.  The patient has episode of hematemesis.  Gastroenterology consulted, done by Ephriam Knuckles and the patient     underwent endoscopy, result as above, severe exudative esophagitis     and jejunitis.  The patient was advised quitting drinking and     Protonix b.i.d. was prescribed to the patient for 6 weeks, then     daily indefinitely.  CLOtest was negative and the patient was     advised about no drinking. 4. Acute renal failure, resolved, which failed secondary to the acute     GI bleeding. 5. The patient has chronic calcified pancreatitis.  This is managed     with pain medications. 6. The patient has abnormal CT scan which suggest bilateral renal     lesions, it is not simple cyst.  I spoke myself to the wife and     informed about importance of proceeding with MRI with and without     contrast as outpatient within the next 1 month.  Also the patient     has pulmonary nodule and the patient is high risk for lung cancer     putting on  consideration history of smoking.  The patient was     advised to repeat a CT scan of the chest within 6 to 1 year.  Blood workup for today, white blood cells 8.7, hemoglobin 9.6, hematocrit 29.4, platelets 223.  Sodium 138, potassium 3.9, chloride 106, CO2 of 25, glucose 158, BUN 7, creatinine 0.75, calcium 9.  Currently, we felt the patient is stable to be discharged to the nursing facility.     Liora Myles Bosie Helper, MD     HIE/MEDQ  D:  05/16/2010  T:  05/16/2010  Job:  [id number]  Electronically Signed by Ebony Cargo MD on 05/25/2010 02:18:37 PM

## 2010-05-30 NOTE — Consult Note (Signed)
NAME:  [name] [name], [name] [name]             ACCOUNT NO.:  [id number]  MEDICAL RECORD NO.:  [id number]          PATIENT TYPE:  INP  LOCATION:  1223                         FACILITY:  [facility]  PHYSICIAN:  Joycelyn Schmid, MD   DATE OF BIRTH:  May 21, 1950  DATE OF CONSULTATION:  04/26/2010 DATE OF DISCHARGE:                                CONSULTATION   TIME OF CONSULTATION:  11.10 p.m.  REFERRING PHYSICIAN:  Altha Harm, MD  REASON FOR CONSULTATION:  Multiple seizures.  HISTORY OF PRESENT ILLNESS:  A 60 year old male with history of traumatic subarachnoid hemorrhage, traumatic subdural hemorrhage, seizure disorder since 2001, hypertension, alcohol dependence and abuse who was at home with his wife when he was found to have multiple seizures at approximately 5 o'clock in the morning.  Paramedics were called at the scene and witnessed additional seizure activity.  The patient was brought to [facility] Room for further evaluationand reported the patient had additional seizures in the emergency room around 3 o'clock p.m.  He received additional Ativan and Dilantin load. He is admitted to intensive care unit and since arriving here, he has had no further seizures.  PAST MEDICAL HISTORY: 1. Seizure disorder since 2001 with multiple breakthrough seizures in     the setting of alcohol abuse and noncompliance with medication. 2. Prior traumatic subarachnoid hemorrhage and subdural hematoma. 3. Prior ischemic infarctions. 4. Diabetes. 5. Hypertension. 6. Dementia. 7. Alcohol dependence and abuse.  FAMILY HISTORY:  Sister with colon cancer.  SOCIAL HISTORY:  He is married, lives with his wife.  He smokes cigarettes and has heavy alcohol use.  ALLERGIES:  No known drug allergies.  MEDICATIONS:  In the hospital; 1. Keppra 1500 mg IV q.12 h. 2. NovoLog. 3. Normodyne. 4. Tylenol and Ativan p.r.n.  REVIEW OF SYSTEMS:  As per HPI.  The patient is unable to  provide information as he is postictal at this time.  PHYSICAL EXAMINATION:  VITAL SIGNS:  Temperature 101.4, blood pressure 103/74, heart rate 80, respirations 19, 97% on 2 L nasal cannula. GENERAL:  The patient is sleeping.  He is a little bit restless.  He is postictal.  He does wake to physical stimulation.  Opens eyes, makes eye contact.  He is able to say a few simple words and sentences.  He minimally follows commands.  He is able to make some simple conversation. CRANIAL NERVE EXAMINATION:  Pupils reactive from 2 to 1 mm.  He has some roving eye movements.  He does not follow commands on extraocular muscle testing.  Face appears symmetric.  No definite sensory deficits in the face.  He has poor dentition.  Tongue is midline.  Motor examination, moves all extremities spontaneously.  He withdraws to physical stimulation.  He does not follow commands.  He has diffuse motor and persistence and does not maintain his arms or legs to antigravity. Reflexes 1+ throughout, triple flexion on plantar stimulation. CARDIOVASCULAR EXAMINATION:  Regular rate and rhythm.  No murmurs or carotid bruits.  LAB TESTING:  Dilantin level 21.4, albumin 3.6, corrected Dilantin level is 26.  Sodium 138, BUN 8, creatinine 0.8, glucose 150, white  count 8.7, platelets 145,000.  CT scan head which I reviewed shows mild atrophy and moderate chronic small vessel ischemic disease.  ASSESSMENT AND RECOMMENDATIONS:  This 60 year old male with history of seizure disorder since 2001, now with additional breakthrough seizures likely due to combination of alcohol abuse, noncompliance with medication and possible infection.  RECOMMENDATIONS: 1. Continue Keppra 1500 mg IV q.12 h., transition to p.o. when able to     do so. 2. Continue CIWA  protocol. 3. Would stop Dilantin and let level gradually return to 0.  No need     to maintain long-term Dilantin use.  If the patient has further     breakthrough seizures,  would then begin maintenance Dilantin     dosing.     Joycelyn Schmid, MD     VP/MEDQ  D:  04/26/2010  T:  04/27/2010  Job:  [id number]  Electronically Signed by Joycelyn Schmid  on 05/30/2010 12:38:10 PM

## 2010-06-02 NOTE — H&P (Signed)
NAME:  WISE, FEES             ACCOUNT NO.:  [id number]  MEDICAL RECORD NO.:  [id number]          PATIENT TYPE:  EMS  LOCATION:  ED                           [facility]ACILITY[facility]  [facility]  PHYSICIAN:  Altha Harm, MDDATE OF BIRTH:  1950-08-27  DATE OF ADMISSION:  04/26/2010 DATE OF DISCHARGE:                             HISTORY & PHYSICAL   PRIMARY CARE PHYSICIAN:  Unassigned.  CHIEF COMPLAINT:  Seizure.  HISTORY OF PRESENT ILLNESS:  The patient is a 60 year old African American male with known seizure disorder and history of breakthrough seizures, also with history of EtOH abuse and prior CVA, who presents to the emergency department after several seizures this morning.  According to the patient's wife, he has been discharged from skilled nursing facility since April 15, 2010.  He has been living at home with his wife; however, wife states he leaves daily to drink heavily with his friends and is noncompliant with medications.  The patient last drank alcohol on April 25, 2010.  However, wife states this was a smaller amount than the normal.  Wife noticed seizure activity while the patient was lying in bed this morning.  The patient had a recurrent seizure en route to the hospital at which time, he was administered IV Ativan by EMS.  Wife reports that the patient complained of a headache just prior to onset of seizures.  Throughout evaluation in the emergency department, the patient has become increasingly agitated with continued confusion, exhibiting signs of alcohol withdrawal.  He has had no recurrent seizures since arrival to the emergency department.  The patient is to be admitted by [facility] hospitalist at this time for further evaluation and treatment.  PAST MEDICAL HISTORY: 1. Seizure disorder with history of breakthrough seizures. 2. Prior cerebrovascular accident     a.     Right thalamic infarct in June 2010.     b.     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 3. Type 2  diabetes, diet controlled. 4. History of heavy EtOH use. 5. Dementia. 6. Hypertension. 7. GERD. 8. History of intracranial hemorrhage in 2002 and again in 2010.  MEDICATIONS:  Per nursing home discharge on January 20th, 2012.  Note, the patient is noncompliant with medications per his wife. 1. Hydrocodone/APAP 5/500 one tablet p.o. q.6 h. p.r.n. pain. 2. Systane eye drops 4 times daily as needed for dry eyes. 3. Zofran 8 mg sublingual q.6 hours p.r.n. nausea. 4. Magnesium oxide 400 mg p.o. daily. 5. Folic acid 1 mg p.o. daily. 6. Namenda 5 mg p.o. b.i.d. 7. Mirtazapine 50 mg p.o. q.h.s. 8. Depakote Sprinkle 125 mg p.o. q.h.s. 9. Effexor XR 75 mg p.o. daily. 10.Thiamine 100 mg p.o. daily. 11.Omeprazole 40 mg p.o. daily. 12.Multivitamin 1 tablet p.o. daily. 13.Keppra 500 mg 3 tablets p.o. b.i.d. 14.Labetalol 100 mg p.o. b.i.d. 15.Ativan 0.5 mg tabs 1 tablet p.o. q.6 h. p.r.n. anxiety. 16.Aricept 10 mg p.o. daily.  ALLERGIES:  No known drug allergies.  FAMILY HISTORY:  Sister deceased at age 46 with colon cancer.  Strong family history of EtOH abuse according to wife.  SOCIAL HISTORY:  The patient is married.  He lives at home  with his wife who reports heavy EtOH abuse and positive tobacco abuse.  REVIEW OF SYSTEMS:  Unable to obtain as the patient is sedated on admission evaluation.  PHYSICAL EXAM:  VITAL SIGNS:  Blood pressure 177/84, heart rate 80, respirations 20, temperature 98.5, O2 sats 99% on room air. GENERAL:  This is a thin African American male lying supine in stretcher replying to request, but quite sedated. HEENT:  Head is normocephalic, atraumatic.  Eyes, extraocular movements are intact without scleral icterus or injection.  Ear, nose and throat, patient unwilling to cooperate with exam. NECK:  Supple with no thyromegaly, lymphadenopathy.  No JVD or carotid bruits. CHEST:  With symmetrical movement, nontender to palpation. CARDIOVASCULAR:  S1 and S2 regular  rate and rhythm.  No murmur, rub, or gallop.  No lower extremity edema.  RESPIRATORY:  Lung sounds are clear to auscultation bilaterally anteriorly with no increased work of breathing.  No wheezes, rales or crackles. GI: Abdomen is flat, soft, nontender, nondistended with positive bowel sounds.  No appreciated masses or hepatosplenomegaly.  NEUROLOGICAL: The patient is able to move all extremities x4 with 4/5 strength throughout.  No focal motor or sensory deficits are noted on exam. However, exam is limited given the patient's sedation and possible postictal state.  LABORATORY DATA:  Pertinent and ancillary studies, White cell count 7.3, platelet count 125, hemoglobin 14.2, hematocrit 42.4, i-STAT shows sodium of 140, potassium 3.7, chloride 107, CO2 22, BUN 18, creatinine 1.0, serum glucose 188, Depakote level 11.3.  Urine drug screen is positive for benzodiazepines and opiates.  EtOH is less than 5.  CT of the head with no acute findings showing remote infarcts.  ASSESSMENT/PLAN: 1. Breakthrough seizures with history of seizure disorder.  Likely     related to heavy EtOH abuse, lowering seizure threshold, and with     medication noncompliance.  As mentioned above, CT of the head     obtained on admission does not show any acute intracranial event.     We  will admit the patient given the patient's continued postictal     state and sedation with Ativan, we will administer meds IV for now     with transition to p.o.'s when more alert.  We will order for     Keppra IV per home dose and ask pharmacy to dose Depakote IV.  I do     not see any need for further imaging at this time as patient with     known history of seizures with obvious cause of breakthrough     seizures and negative head CT on admission. 2. EtOH abuse.  The patient is demonstrating some signs of withdrawal     at time of admission.  However, symptoms can also be confused with     a postictal state.  We will order for  Ativan withdrawal protocol     and monitor. 3. Hypertension.  Given that the patient is unlikely to tolerate     p.o.'s at this time, we will order for p.r.n. hydralazine to be     administered IV, we will resume home meds when the patient is more     alert. 4. Type 2 diabetes.  We will order for sliding scale insulin while     inpatient and check hemoglobin A1c. 5. Thrombocytopenia.  Likely chronic disease in the setting of chronic     alcohol abuse, we will monitor. 6. Prophylaxis.  We will order for PPI for GI prophylaxis and SCDs  for     DVT prophylaxis. 7. Code status.  The patient is a full code. 8. Disposition.  We will ask clinical social worker to consult as wife     is requesting skilled nursing facility placement at time of     discharge.     Cordelia Pen, NP   ______________________________ Altha Harm, MD    LE/MEDQ  D:  04/26/2010  T:  04/26/2010  Job:  [id number]  Electronically Signed by Cordelia Pen NP on 05/23/2010 09:45:23 AM Electronically Signed by Marthann Schiller MD on 06/01/2010 10:05:21 PM

## 2010-06-11 LAB — CBC
HCT: 44.6 % (ref 39.0–52.0)
HCT: 45.8 % (ref 39.0–52.0)
Hemoglobin: 16.1 g/dL (ref 13.0–17.0)
MCH: 30.6 pg (ref 26.0–34.0)
MCHC: 33.5 g/dL (ref 30.0–36.0)
MCHC: 34.1 g/dL (ref 30.0–36.0)
MCV: 91.2 fL (ref 78.0–100.0)
MCV: 95.8 fL (ref 78.0–100.0)
MCV: 95.9 fL (ref 78.0–100.0)
Platelets: 235 10*3/uL (ref 150–400)
Platelets: 86 10*3/uL — ABNORMAL LOW (ref 150–400)
Platelets: 99 10*3/uL — ABNORMAL LOW (ref 150–400)
RBC: 5.25 MIL/uL (ref 4.22–5.81)
RDW: 14.8 % (ref 11.5–15.5)
RDW: 15.1 % (ref 11.5–15.5)
WBC: 11.6 10*3/uL — ABNORMAL HIGH (ref 4.0–10.5)
WBC: 5.3 10*3/uL (ref 4.0–10.5)

## 2010-06-11 LAB — GLUCOSE, CAPILLARY
Glucose-Capillary: 208 mg/dL — ABNORMAL HIGH (ref 70–99)
Glucose-Capillary: 258 mg/dL — ABNORMAL HIGH (ref 70–99)
Glucose-Capillary: 309 mg/dL — ABNORMAL HIGH (ref 70–99)
Glucose-Capillary: 44 mg/dL — CL (ref 70–99)
Glucose-Capillary: 62 mg/dL — ABNORMAL LOW (ref 70–99)
Glucose-Capillary: 81 mg/dL (ref 70–99)
Glucose-Capillary: 85 mg/dL (ref 70–99)
Glucose-Capillary: 95 mg/dL (ref 70–99)

## 2010-06-11 LAB — COMPREHENSIVE METABOLIC PANEL
AST: 23 U/L (ref 0–37)
AST: 50 U/L — ABNORMAL HIGH (ref 0–37)
Albumin: 4.3 g/dL (ref 3.5–5.2)
Albumin: 2.8 g/dL — ABNORMAL LOW (ref 3.5–5.2)
Alkaline Phosphatase: 111 U/L (ref 39–117)
BUN: 27 mg/dL — ABNORMAL HIGH (ref 6–23)
BUN: 6 mg/dL (ref 6–23)
CO2: 27 mEq/L (ref 19–32)
CO2: 25 mEq/L (ref 19–32)
Calcium: 8 mg/dL — ABNORMAL LOW (ref 8.4–10.5)
Calcium: 9.1 mg/dL (ref 8.4–10.5)
Chloride: 98 mEq/L (ref 96–112)
Chloride: 102 mEq/L (ref 96–112)
Chloride: 105 mEq/L (ref 96–112)
Creatinine, Ser: 1.36 mg/dL (ref 0.4–1.5)
Creatinine, Ser: 0.65 mg/dL (ref 0.4–1.5)
Creatinine, Ser: 0.69 mg/dL (ref 0.4–1.5)
GFR calc Af Amer: >60 mL/min (ref >60)
GFR calc Af Amer: >60 mL/min (ref >60)
GFR calc Af Amer: >60 mL/min (ref >60)
GFR calc non Af Amer: 54 mL/min — ABNORMAL LOW (ref >60)
GFR calc non Af Amer: >60 mL/min (ref >60)
Glucose, Bld: 150 mg/dL — ABNORMAL HIGH (ref 70–99)
Potassium: 3.4 mEq/L — ABNORMAL LOW (ref 3.5–5.1)
Total Bilirubin: 0.9 mg/dL (ref 0.3–1.2)
Total Bilirubin: 0.7 mg/dL (ref 0.3–1.2)
Total Bilirubin: 1 mg/dL (ref 0.3–1.2)

## 2010-06-11 LAB — PROTIME-INR
INR: 0.93 (ref 0.00–1.49)
Prothrombin Time: 12.4 seconds (ref 11.6–15.2)

## 2010-06-11 LAB — DIFFERENTIAL
Basophils Absolute: 0 10*3/uL (ref 0.0–0.1)
Basophils Absolute: 0 10*3/uL (ref 0.0–0.1)
Basophils Relative: 0 % (ref 0–1)
Eosinophils Absolute: 0 10*3/uL (ref 0.0–0.7)
Eosinophils Relative: 0 % (ref 0–5)
Eosinophils Relative: 0 % (ref 0–5)
Lymphocytes Relative: 9 % — ABNORMAL LOW (ref 12–46)
Lymphocytes Relative: 8 % — ABNORMAL LOW (ref 12–46)
Monocytes Absolute: 0.8 10*3/uL (ref 0.1–1.0)
Neutrophils Relative %: 84 % — ABNORMAL HIGH (ref 43–77)

## 2010-06-11 LAB — URINALYSIS, ROUTINE W REFLEX MICROSCOPIC
Glucose, UA: NEGATIVE mg/dL
Hgb urine dipstick: NEGATIVE
Ketones, ur: 40 mg/dL — AB
Protein, ur: NEGATIVE mg/dL
Urobilinogen, UA: 0.2 mg/dL (ref 0.0–1.0)

## 2010-06-11 LAB — PHENYTOIN LEVEL, FREE AND TOTAL
Phenytoin Bound: 19 mg/L
Phenytoin, Total: 21.7 mg/L — ABNORMAL HIGH (ref 10.0–20.0)

## 2010-06-11 LAB — CK TOTAL AND CKMB (NOT AT ARMC): Relative Index: INVALID (ref 0.0–2.5)

## 2010-06-11 LAB — RAPID URINE DRUG SCREEN, HOSP PERFORMED
Barbiturates: NOT DETECTED
Cocaine: NOT DETECTED
Opiates: NOT DETECTED

## 2010-06-11 LAB — LIPASE, BLOOD: Lipase: 34 U/L (ref 11–59)

## 2010-06-11 LAB — BASIC METABOLIC PANEL
BUN: 2 mg/dL — ABNORMAL LOW (ref 6–23)
BUN: 9 mg/dL (ref 6–23)
Chloride: 102 mEq/L (ref 96–112)
Glucose, Bld: 134 mg/dL — ABNORMAL HIGH (ref 70–99)
Potassium: 3.3 mEq/L — ABNORMAL LOW (ref 3.5–5.1)

## 2010-06-11 LAB — TROPONIN I: Troponin I: 0.07 ng/mL — ABNORMAL HIGH (ref 0.00–0.06)

## 2010-06-11 LAB — POCT CARDIAC MARKERS
CKMB, poc: <1 ng/mL — ABNORMAL LOW (ref 1.0–8.0)
Troponin i, poc: <0.05 ng/mL (ref 0.00–0.09)

## 2010-06-12 LAB — CBC
HCT: 45.1 % (ref 39.0–52.0)
Hemoglobin: 13.9 g/dL (ref 13.0–17.0)
Hemoglobin: 14.8 g/dL (ref 13.0–17.0)
MCHC: 33.9 g/dL (ref 30.0–36.0)
MCV: 95.4 fL (ref 78.0–100.0)
RBC: 4.29 MIL/uL (ref 4.22–5.81)
RDW: 12.8 % (ref 11.5–15.5)

## 2010-06-12 LAB — GLUCOSE, CAPILLARY
Glucose-Capillary: 106 mg/dL — ABNORMAL HIGH (ref 70–99)
Glucose-Capillary: 108 mg/dL — ABNORMAL HIGH (ref 70–99)
Glucose-Capillary: 110 mg/dL — ABNORMAL HIGH (ref 70–99)
Glucose-Capillary: 142 mg/dL — ABNORMAL HIGH (ref 70–99)
Glucose-Capillary: 146 mg/dL — ABNORMAL HIGH (ref 70–99)
Glucose-Capillary: 165 mg/dL — ABNORMAL HIGH (ref 70–99)
Glucose-Capillary: 169 mg/dL — ABNORMAL HIGH (ref 70–99)

## 2010-06-12 LAB — COMPREHENSIVE METABOLIC PANEL
Alkaline Phosphatase: 117 U/L (ref 39–117)
BUN: 15 mg/dL (ref 6–23)
GFR calc non Af Amer: >60 mL/min (ref >60)
Glucose, Bld: 138 mg/dL — ABNORMAL HIGH (ref 70–99)
Potassium: 3.4 mEq/L — ABNORMAL LOW (ref 3.5–5.1)
Total Bilirubin: 0.9 mg/dL (ref 0.3–1.2)
Total Protein: 7.2 g/dL (ref 6.0–8.3)

## 2010-06-12 LAB — BASIC METABOLIC PANEL
BUN: 25 mg/dL — ABNORMAL HIGH (ref 6–23)
CO2: 20 mEq/L (ref 19–32)
CO2: 22 mEq/L (ref 19–32)
CO2: 24 mEq/L (ref 19–32)
CO2: 24 mEq/L (ref 19–32)
Calcium: 7.9 mg/dL — ABNORMAL LOW (ref 8.4–10.5)
Calcium: 8.2 mg/dL — ABNORMAL LOW (ref 8.4–10.5)
Calcium: 8.4 mg/dL (ref 8.4–10.5)
Chloride: 101 mEq/L (ref 96–112)
Chloride: 105 mEq/L (ref 96–112)
Creatinine, Ser: 0.64 mg/dL (ref 0.4–1.5)
Creatinine, Ser: 0.71 mg/dL (ref 0.4–1.5)
Creatinine, Ser: 0.94 mg/dL (ref 0.4–1.5)
GFR calc Af Amer: >60 mL/min (ref >60)
GFR calc Af Amer: >60 mL/min (ref >60)
Glucose, Bld: 114 mg/dL — ABNORMAL HIGH (ref 70–99)
Glucose, Bld: 142 mg/dL — ABNORMAL HIGH (ref 70–99)
Glucose, Bld: 169 mg/dL — ABNORMAL HIGH (ref 70–99)
Potassium: 4.2 mEq/L (ref 3.5–5.1)
Sodium: 131 mEq/L — ABNORMAL LOW (ref 135–145)
Sodium: 132 mEq/L — ABNORMAL LOW (ref 135–145)

## 2010-06-12 LAB — DIFFERENTIAL
Basophils Absolute: 0 10*3/uL (ref 0.0–0.1)
Basophils Relative: 0 % (ref 0–1)
Monocytes Relative: 8 % (ref 3–12)
Neutro Abs: 9 10*3/uL — ABNORMAL HIGH (ref 1.7–7.7)
Neutrophils Relative %: 79 % — ABNORMAL HIGH (ref 43–77)

## 2010-06-12 LAB — LIPASE, BLOOD: Lipase: 45 U/L (ref 11–59)

## 2010-06-12 LAB — CLOSTRIDIUM DIFFICILE EIA

## 2010-06-12 LAB — PHENYTOIN LEVEL, TOTAL: Phenytoin Lvl: 3.1 ug/mL — ABNORMAL LOW (ref 10.0–20.0)

## 2010-06-13 LAB — RAPID URINE DRUG SCREEN, HOSP PERFORMED
Amphetamines: NOT DETECTED
Tetrahydrocannabinol: NOT DETECTED

## 2010-06-13 LAB — CBC
HCT: 40.2 % (ref 39.0–52.0)
Hemoglobin: 13.7 g/dL (ref 13.0–17.0)
Hemoglobin: 15.9 g/dL (ref 13.0–17.0)
Platelets: 165 10*3/uL (ref 150–400)
RBC: 4.47 MIL/uL (ref 4.22–5.81)
RBC: 5.08 MIL/uL (ref 4.22–5.81)
RDW: 12.9 % (ref 11.5–15.5)
WBC: 11.7 10*3/uL — ABNORMAL HIGH (ref 4.0–10.5)

## 2010-06-13 LAB — BASIC METABOLIC PANEL
Chloride: 110 mEq/L (ref 96–112)
GFR calc Af Amer: >60 mL/min (ref >60)
GFR calc non Af Amer: >60 mL/min (ref >60)
Potassium: 3.1 mEq/L — ABNORMAL LOW (ref 3.5–5.1)
Sodium: 140 mEq/L (ref 135–145)

## 2010-06-13 LAB — URINALYSIS, ROUTINE W REFLEX MICROSCOPIC
Leukocytes, UA: NEGATIVE
Protein, ur: 100 mg/dL — AB
Urobilinogen, UA: 0.2 mg/dL (ref 0.0–1.0)

## 2010-06-13 LAB — DIFFERENTIAL
Basophils Absolute: 0 10*3/uL (ref 0.0–0.1)
Lymphocytes Relative: 4 % — ABNORMAL LOW (ref 12–46)
Monocytes Absolute: 0.4 10*3/uL (ref 0.1–1.0)
Monocytes Relative: 3 % (ref 3–12)
Neutro Abs: 11.9 10*3/uL — ABNORMAL HIGH (ref 1.7–7.7)
Neutrophils Relative %: 93 % — ABNORMAL HIGH (ref 43–77)

## 2010-06-13 LAB — GLUCOSE, CAPILLARY
Glucose-Capillary: 123 mg/dL — ABNORMAL HIGH (ref 70–99)
Glucose-Capillary: 190 mg/dL — ABNORMAL HIGH (ref 70–99)
Glucose-Capillary: 192 mg/dL — ABNORMAL HIGH (ref 70–99)

## 2010-06-13 LAB — URINE MICROSCOPIC-ADD ON

## 2010-06-13 LAB — ETHANOL: Alcohol, Ethyl (B): <5 mg/dL (ref 0–10)

## 2010-06-14 LAB — DIFFERENTIAL
Eosinophils Relative: 1 % (ref 0–5)
Lymphocytes Relative: 7 % — ABNORMAL LOW (ref 12–46)
Lymphs Abs: 0.7 10*3/uL (ref 0.7–4.0)
Monocytes Absolute: 0.5 10*3/uL (ref 0.1–1.0)

## 2010-06-14 LAB — RAPID URINE DRUG SCREEN, HOSP PERFORMED
Amphetamines: NOT DETECTED
Benzodiazepines: POSITIVE — AB
Cocaine: NOT DETECTED
Opiates: NOT DETECTED
Tetrahydrocannabinol: NOT DETECTED

## 2010-06-14 LAB — URINALYSIS, ROUTINE W REFLEX MICROSCOPIC
Bilirubin Urine: NEGATIVE
Hgb urine dipstick: NEGATIVE
Specific Gravity, Urine: 1.024 (ref 1.005–1.030)
pH: 5.5 (ref 5.0–8.0)

## 2010-06-14 LAB — BASIC METABOLIC PANEL
CO2: 27 mEq/L (ref 19–32)
Calcium: 8.7 mg/dL (ref 8.4–10.5)
Creatinine, Ser: 0.76 mg/dL (ref 0.4–1.5)
Glucose, Bld: 113 mg/dL — ABNORMAL HIGH (ref 70–99)

## 2010-06-14 LAB — GLUCOSE, CAPILLARY
Glucose-Capillary: 115 mg/dL — ABNORMAL HIGH (ref 70–99)
Glucose-Capillary: 120 mg/dL — ABNORMAL HIGH (ref 70–99)
Glucose-Capillary: 157 mg/dL — ABNORMAL HIGH (ref 70–99)
Glucose-Capillary: 178 mg/dL — ABNORMAL HIGH (ref 70–99)
Glucose-Capillary: 179 mg/dL — ABNORMAL HIGH (ref 70–99)

## 2010-06-14 LAB — CBC
Hemoglobin: 11.4 g/dL — ABNORMAL LOW (ref 13.0–17.0)
MCHC: 34.2 g/dL (ref 30.0–36.0)
MCV: 92.1 fL (ref 78.0–100.0)
Platelets: 160 10*3/uL (ref 150–400)
RDW: 13.8 % (ref 11.5–15.5)
WBC: 9.2 10*3/uL (ref 4.0–10.5)

## 2010-06-14 LAB — COMPREHENSIVE METABOLIC PANEL
ALT: 60 U/L — ABNORMAL HIGH (ref 0–53)
AST: 34 U/L (ref 0–37)
Albumin: 3.9 g/dL (ref 3.5–5.2)
Chloride: 109 mEq/L (ref 96–112)
Creatinine, Ser: 0.56 mg/dL (ref 0.4–1.5)
GFR calc Af Amer: >60 mL/min (ref >60)
Sodium: 136 mEq/L (ref 135–145)
Total Bilirubin: 0.2 mg/dL — ABNORMAL LOW (ref 0.3–1.2)

## 2010-06-14 LAB — ETHANOL: Alcohol, Ethyl (B): <5 mg/dL (ref 0–10)

## 2010-07-04 LAB — BASIC METABOLIC PANEL
BUN: 4 mg/dL — ABNORMAL LOW (ref 6–23)
BUN: 10 mg/dL (ref 6–23)
BUN: 15 mg/dL (ref 6–23)
CO2: 25 mEq/L (ref 19–32)
CO2: 27 mEq/L (ref 19–32)
CO2: 27 mEq/L (ref 19–32)
Chloride: 106 mEq/L (ref 96–112)
Chloride: 100 mEq/L (ref 96–112)
Chloride: 104 mEq/L (ref 96–112)
Creatinine, Ser: 0.7 mg/dL (ref 0.4–1.5)
Creatinine, Ser: 0.8 mg/dL (ref 0.4–1.5)
GFR calc Af Amer: >60 mL/min (ref >60)
GFR calc non Af Amer: >60 mL/min (ref >60)
Glucose, Bld: 96 mg/dL (ref 70–99)
Glucose, Bld: 198 mg/dL — ABNORMAL HIGH (ref 70–99)
Potassium: 3.8 mEq/L (ref 3.5–5.1)

## 2010-07-04 LAB — CBC
HCT: 39.1 % (ref 39.0–52.0)
MCHC: 33.9 g/dL (ref 30.0–36.0)
MCHC: 33.9 g/dL (ref 30.0–36.0)
MCV: 99.5 fL (ref 78.0–100.0)
MCV: 99.1 fL (ref 78.0–100.0)
Platelets: 236 10*3/uL (ref 150–400)
Platelets: 229 10*3/uL (ref 150–400)
RDW: 13.7 % (ref 11.5–15.5)
WBC: 7.6 10*3/uL (ref 4.0–10.5)

## 2010-07-04 LAB — GLUCOSE, CAPILLARY
Glucose-Capillary: 151 mg/dL — ABNORMAL HIGH (ref 70–99)
Glucose-Capillary: 163 mg/dL — ABNORMAL HIGH (ref 70–99)

## 2010-07-04 LAB — CARDIAC PANEL(CRET KIN+CKTOT+MB+TROPI)
CK, MB: 0.7 ng/mL (ref 0.3–4.0)
Relative Index: INVALID (ref 0.0–2.5)
Relative Index: INVALID (ref 0.0–2.5)
Troponin I: 0.01 ng/mL (ref 0.00–0.06)

## 2010-07-04 LAB — PROTIME-INR: INR: 0.9 (ref 0.00–1.49)

## 2010-07-04 LAB — PHENYTOIN LEVEL, TOTAL: Phenytoin Lvl: 15.8 ug/mL (ref 10.0–20.0)

## 2010-07-05 LAB — GLUCOSE, CAPILLARY
Glucose-Capillary: 111 mg/dL — ABNORMAL HIGH (ref 70–99)
Glucose-Capillary: 111 mg/dL — ABNORMAL HIGH (ref 70–99)
Glucose-Capillary: 118 mg/dL — ABNORMAL HIGH (ref 70–99)
Glucose-Capillary: 125 mg/dL — ABNORMAL HIGH (ref 70–99)
Glucose-Capillary: 131 mg/dL — ABNORMAL HIGH (ref 70–99)
Glucose-Capillary: 144 mg/dL — ABNORMAL HIGH (ref 70–99)
Glucose-Capillary: 148 mg/dL — ABNORMAL HIGH (ref 70–99)
Glucose-Capillary: 199 mg/dL — ABNORMAL HIGH (ref 70–99)

## 2010-07-05 LAB — AMYLASE: Amylase: 76 U/L (ref 27–131)

## 2010-07-05 LAB — DIFFERENTIAL
Basophils Relative: 0 % (ref 0–1)
Eosinophils Absolute: 0 10*3/uL (ref 0.0–0.7)
Monocytes Absolute: 0.2 10*3/uL (ref 0.1–1.0)
Monocytes Relative: 3 % (ref 3–12)

## 2010-07-05 LAB — URINE MICROSCOPIC-ADD ON

## 2010-07-05 LAB — RAPID URINE DRUG SCREEN, HOSP PERFORMED
Amphetamines: NOT DETECTED
Tetrahydrocannabinol: NOT DETECTED

## 2010-07-05 LAB — SODIUM, URINE, RANDOM: Sodium, Ur: 108 mEq/L

## 2010-07-05 LAB — URINALYSIS, ROUTINE W REFLEX MICROSCOPIC
Nitrite: NEGATIVE
Specific Gravity, Urine: 1.02 (ref 1.005–1.030)
Urobilinogen, UA: 0.2 mg/dL (ref 0.0–1.0)

## 2010-07-05 LAB — CBC
HCT: 45.8 % (ref 39.0–52.0)
Platelets: 177 10*3/uL (ref 150–400)
Platelets: 178 10*3/uL (ref 150–400)
RDW: 15.9 % — ABNORMAL HIGH (ref 11.5–15.5)
WBC: 7.3 10*3/uL (ref 4.0–10.5)

## 2010-07-05 LAB — BASIC METABOLIC PANEL
BUN: 7 mg/dL (ref 6–23)
CO2: 26 mEq/L (ref 19–32)
Chloride: 109 mEq/L (ref 96–112)
Glucose, Bld: 102 mg/dL — ABNORMAL HIGH (ref 70–99)
Potassium: 3.5 mEq/L (ref 3.5–5.1)
Sodium: 138 mEq/L (ref 135–145)

## 2010-07-05 LAB — COMPREHENSIVE METABOLIC PANEL
ALT: 21 U/L (ref 0–53)
ALT: 29 U/L (ref 0–53)
AST: 24 U/L (ref 0–37)
Albumin: 3.3 g/dL — ABNORMAL LOW (ref 3.5–5.2)
Albumin: 3.8 g/dL (ref 3.5–5.2)
Alkaline Phosphatase: 109 U/L (ref 39–117)
Alkaline Phosphatase: 89 U/L (ref 39–117)
BUN: 8 mg/dL (ref 6–23)
Chloride: 109 mEq/L (ref 96–112)
Potassium: 2.7 mEq/L — CL (ref 3.5–5.1)
Potassium: 3.5 mEq/L (ref 3.5–5.1)
Sodium: 138 mEq/L (ref 135–145)
Sodium: 140 mEq/L (ref 135–145)
Total Bilirubin: 1.3 mg/dL — ABNORMAL HIGH (ref 0.3–1.2)
Total Protein: 7 g/dL (ref 6.0–8.3)

## 2010-07-05 LAB — ETHANOL: Alcohol, Ethyl (B): <5 mg/dL (ref 0–10)

## 2010-07-05 LAB — APTT: aPTT: 23 seconds — ABNORMAL LOW (ref 24–37)

## 2010-07-27 ENCOUNTER — Inpatient Hospital Stay (HOSPITAL_COMMUNITY)
Admission: EM | Admit: 2010-07-27 | Discharge: 2010-07-30 | DRG: 897 | Disposition: A | Discharge status: Home / Self Care | Payer: Medicare Other | Attending: Emergency Medicine | Admitting: Emergency Medicine

## 2010-07-27 ENCOUNTER — Emergency Department (HOSPITAL_COMMUNITY): Payer: Medicare Other

## 2010-07-27 DIAGNOSIS — Z8673 Personal history of transient ischemic attack (TIA), and cerebral infarction without residual deficits: Secondary | ICD-10-CM

## 2010-07-27 DIAGNOSIS — G40309 Generalized idiopathic epilepsy and epileptic syndromes, not intractable, without status epilepticus: Secondary | ICD-10-CM | POA: Diagnosis present

## 2010-07-27 DIAGNOSIS — F10939 Alcohol use, unspecified with withdrawal, unspecified: Principal | ICD-10-CM | POA: Diagnosis present

## 2010-07-27 DIAGNOSIS — F10239 Alcohol dependence with withdrawal, unspecified: Principal | ICD-10-CM | POA: Diagnosis present

## 2010-07-27 DIAGNOSIS — I10 Essential (primary) hypertension: Secondary | ICD-10-CM | POA: Diagnosis present

## 2010-07-27 DIAGNOSIS — F17200 Nicotine dependence, unspecified, uncomplicated: Secondary | ICD-10-CM | POA: Diagnosis present

## 2010-07-27 DIAGNOSIS — E119 Type 2 diabetes mellitus without complications: Secondary | ICD-10-CM | POA: Diagnosis present

## 2010-07-27 DIAGNOSIS — F068 Other specified mental disorders due to known physiological condition: Secondary | ICD-10-CM | POA: Diagnosis present

## 2010-07-27 DIAGNOSIS — K219 Gastro-esophageal reflux disease without esophagitis: Secondary | ICD-10-CM | POA: Diagnosis present

## 2010-07-27 DIAGNOSIS — Z7982 Long term (current) use of aspirin: Secondary | ICD-10-CM

## 2010-07-27 DIAGNOSIS — Z91199 Patient's noncompliance with other medical treatment and regimen due to unspecified reason: Secondary | ICD-10-CM

## 2010-07-27 DIAGNOSIS — K861 Other chronic pancreatitis: Secondary | ICD-10-CM | POA: Diagnosis present

## 2010-07-27 DIAGNOSIS — Z79899 Other long term (current) drug therapy: Secondary | ICD-10-CM

## 2010-07-27 DIAGNOSIS — F1010 Alcohol abuse, uncomplicated: Secondary | ICD-10-CM | POA: Diagnosis present

## 2010-07-27 DIAGNOSIS — Z9119 Patient's noncompliance with other medical treatment and regimen: Secondary | ICD-10-CM

## 2010-07-27 LAB — COMPREHENSIVE METABOLIC PANEL
ALT: 11 U/L (ref 0–53)
Alkaline Phosphatase: 70 U/L (ref 39–117)
BUN: 14 mg/dL (ref 6–23)
CO2: 23 mEq/L (ref 19–32)
GFR calc non Af Amer: >60 mL/min (ref >60)
Glucose, Bld: 129 mg/dL — ABNORMAL HIGH (ref 70–99)
Potassium: 3.7 mEq/L (ref 3.5–5.1)
Sodium: 136 mEq/L (ref 135–145)
Total Protein: 6.7 g/dL (ref 6.0–8.3)

## 2010-07-27 LAB — CBC
HCT: 35.7 % — ABNORMAL LOW (ref 39.0–52.0)
Hemoglobin: 11.4 g/dL — ABNORMAL LOW (ref 13.0–17.0)
MCHC: 31.9 g/dL (ref 30.0–36.0)
MCV: 82.3 fL (ref 78.0–100.0)
RDW: 13.8 % (ref 11.5–15.5)
WBC: 6.2 10*3/uL (ref 4.0–10.5)

## 2010-07-27 LAB — RAPID URINE DRUG SCREEN, HOSP PERFORMED
Amphetamines: NOT DETECTED
Barbiturates: NOT DETECTED
Benzodiazepines: POSITIVE — AB
Cocaine: NOT DETECTED
Opiates: NOT DETECTED

## 2010-07-27 LAB — ETHANOL: Alcohol, Ethyl (B): <11 mg/dL — ABNORMAL HIGH (ref 0–10)

## 2010-07-27 LAB — GLUCOSE, CAPILLARY
Glucose-Capillary: 102 mg/dL — ABNORMAL HIGH (ref 70–99)
Glucose-Capillary: 108 mg/dL — ABNORMAL HIGH (ref 70–99)
Glucose-Capillary: 114 mg/dL — ABNORMAL HIGH (ref 70–99)
Glucose-Capillary: 115 mg/dL — ABNORMAL HIGH (ref 70–99)

## 2010-07-27 LAB — DIFFERENTIAL
Eosinophils Relative: 0 % (ref 0–5)
Lymphocytes Relative: 9 % — ABNORMAL LOW (ref 12–46)
Lymphs Abs: 0.5 10*3/uL — ABNORMAL LOW (ref 0.7–4.0)
Monocytes Absolute: 0.2 10*3/uL (ref 0.1–1.0)
Neutro Abs: 5.4 10*3/uL (ref 1.7–7.7)

## 2010-07-27 LAB — MRSA PCR SCREENING: MRSA by PCR: NEGATIVE

## 2010-07-27 LAB — PROTIME-INR: Prothrombin Time: 14 seconds (ref 11.6–15.2)

## 2010-07-27 LAB — VALPROIC ACID LEVEL: Valproic Acid Lvl: <10 ug/mL — ABNORMAL LOW (ref 50.0–100.0)

## 2010-07-28 ENCOUNTER — Inpatient Hospital Stay (HOSPITAL_COMMUNITY): Payer: Medicare Other

## 2010-07-28 LAB — CBC
MCH: 26.1 pg (ref 26.0–34.0)
MCV: 82.4 fL (ref 78.0–100.0)
Platelets: 171 10*3/uL (ref 150–400)
RDW: 13.9 % (ref 11.5–15.5)
WBC: 6.8 10*3/uL (ref 4.0–10.5)

## 2010-07-28 LAB — DIFFERENTIAL
Basophils Relative: 0 % (ref 0–1)
Eosinophils Absolute: 0 10*3/uL (ref 0.0–0.7)
Eosinophils Relative: 1 % (ref 0–5)
Lymphs Abs: 1.2 10*3/uL (ref 0.7–4.0)
Neutrophils Relative %: 73 % (ref 43–77)

## 2010-07-28 LAB — CARDIAC PANEL(CRET KIN+CKTOT+MB+TROPI)
CK, MB: 2.4 ng/mL (ref 0.3–4.0)
CK, MB: 2.6 ng/mL (ref 0.3–4.0)
Total CK: 130 U/L (ref 7–232)
Troponin I: <0.3 ng/mL (ref <0.30)
Troponin I: <0.3 ng/mL (ref <0.30)

## 2010-07-28 LAB — GLUCOSE, CAPILLARY
Glucose-Capillary: 102 mg/dL — ABNORMAL HIGH (ref 70–99)
Glucose-Capillary: 123 mg/dL — ABNORMAL HIGH (ref 70–99)
Glucose-Capillary: 165 mg/dL — ABNORMAL HIGH (ref 70–99)

## 2010-07-28 LAB — COMPREHENSIVE METABOLIC PANEL
AST: 18 U/L (ref 0–37)
Albumin: 3.9 g/dL (ref 3.5–5.2)
BUN: 7 mg/dL (ref 6–23)
Creatinine, Ser: 0.72 mg/dL (ref 0.4–1.5)
GFR calc Af Amer: >60 mL/min (ref >60)
Potassium: 3.1 mEq/L — ABNORMAL LOW (ref 3.5–5.1)
Total Protein: 6.9 g/dL (ref 6.0–8.3)

## 2010-07-28 NOTE — H&P (Signed)
NAME:  [name] [name], [name] [name]             ACCOUNT NO.:  [id number]  MEDICAL RECORD NO.:  [id number]           PATIENT TYPE:  I  LOCATION:  3103                         FACILITY:  [facility]  PHYSICIAN:  Oden Lindaman, DO         DATE OF BIRTH:  04-07-1950  DATE OF ADMISSION:  07/27/2010 DATE OF DISCHARGE:                             HISTORY & PHYSICAL   CHIEF COMPLAINT:  Seizures.  HISTORY OF PRESENT ILLNESS:  The patient was brought to the emergency department by EMS.  He arrived confused.  At this time, of course, he has been treated for his seizures and he is lethargic.  The patient was unable to give a decent history but as per EMS, the patient had 2 grand mal seizures that they witnessed and report on.  Again, the patient himself at least for me is lethargic.  I cannot get him to answer questions.  PAST MEDICAL HISTORY:  Significant for medical noncompliance, alcohol abuse, withdrawal seizures, seizure disorder with breakthrough seizures, and he has had strokes including a right thalamic CVA and an old parietooccipital CVA, type 2 diabetes mellitus which periodically diet controlled, heavy alcohol use, dementia, hypertension, GERD, and a history of intracerebral hemorrhage in 2002 and 2010.  MEDICATIONS:  I do not have a complete list of the patient's medications at this time, however, his medications at the time of discharge on May 16, 2010, roughly 10 weeks ago included: 1. Clonidine 0.1 mg 1 p.o. b.i.d. 2. Nicotine patch #21 for 14 days. 3. Protonix 40 mg 1 p.o. b.i.d. 4. Creon 1 tablet 3 times daily with meal and snack. 5. Norvasc 5 mg 1 p.o. b.i.d. 6. Zofran 4 mg 1 p.o. q.6 hours p.r.n. nausea. 7. Aricept 10 mg 1 p.o. at bedtime. 8. Depakote 125 mg 1 p.o. at bedtime. 9. Folic acid 1 mg 1 p.o. daily. 10.Hydrocodone/acetaminophen 5/500 one p.o. q.6 hours p.r.n. pain. 11.Keppra 1500 mg 1 p.o. b.i.d. 12.Magnesium oxide 400 mg 1 p.o. daily. 13.Mirtazapine 15 mg 1 p.o. at  bedtime. 14.Multivitamin 1 p.o. daily. 15.Namenda 5 mg 1 p.o. b.i.d. 16.Systane in both eyes as needed. 17.Thiamine 100 mg 1 p.o. daily. 18.Venlafaxine XR 75 mg 1 p.o. daily.  ALLERGIES:  NKDA.  REVIEW OF SYSTEMS:  Unobtainable.  FAMILY HISTORY:  As per old records, family history, the patient's sister died at age 50 of colon cancer and alcohol abuse is very prevalent in his family.  SOCIAL HISTORY:  The patient is a heavy alcohol drinker.  In a prior H and P, it stated that he drinks as much as he can that is his answer to the question how much he drinks.  He has a 50-pack-year smoking history and continues to smoke.  He lives at home with his wife.  PHYSICAL EXAMINATION:  VITAL SIGNS:  Blood pressure 138/81, heart rate 71, respiratory rate 18, O2 sat 99% on room air, temperature 98. GENERAL:  The patient is lethargic, cachectic elderly African American male.  He will briefly respond to noxious stimuli, however, the patient does appear to be ignoring his left side, and he appears to have some disconjugate gaze,  it is unknown that is new or not. HEENT:  The patient is unable to cooperate with external ocular movement exam.  Pupils equal, round, and reactive to light and accommodation. Sclerae nonicteric, noninjected.  Mouth, oral mucosa moist.  No lesions or sores.  Pharynx is clear.  No erythema or exudate. NECK:  Negative for JVD.  Negative thyromegaly.  Negative lymphadenopathy. HEART:  Regular rate and rhythm at 80 beats per minute without murmur, ectopy, or gallops.  No lateral PMI.  No thrills. LUNGS:  Clear to auscultation bilaterally without wheezes, rales, or rhonchi.  No increased work of breathing.  No tactile fremitus. ABDOMEN:  Scaphoid, soft, nontender, nondistended.  Positive bowel sounds.  No hepatosplenomegaly.  No hernias palpated. EXTREMITIES:  Negative for cyanosis, clubbing, or edema.  The patient has greatly diminished dorsalis pedis and popliteal pulses  bilaterally. No carotid bruits bilaterally. NEUROLOGIC:  The patient is unable to cooperate with the neurological exam as he is lethargic, postictal, and he has been medicated.  LABORATORY:  CT head shows no acute findings.  Portable chest x-ray is also negative.  Depakene level is less than 10.  EKG shows sinus tachycardia.  WBC is 6.2, hemoglobin 11.4, hematocrit 35.7, platelets 134.  Sodium 136, potassium 3.7, chloride 104, CO2 23, BUN is 4, creatinine is 0.69, glucose is 129.  PT is 14,  INR 1.06.  Albumin 3.8, ALT 11, AST 15.  ASSESSMENT: 1. Grand mal seizures, most likely due to alcohol withdrawal.  The     patient also has a negligible Depakene level.  He is noncompliant     with his medications as well. 2. Alcohol withdrawal.  The patient has this although was not seen by     me, a very tremulous, but this has been improved or resolved with     benzodiazepines. 3. Diabetes mellitus type 2.  We will give him fingerstick blood     sugars and sliding scale insulin. 4. Dementia.  We will continue his home medications. 5. Gastroesophageal reflux disease.  The patient will receive a proton     pump inhibitor and the patient has chronic pancreatitis and     pancreatic insufficiency.  We will admit the patient and will cover     him with benzodiazepine.     I will get fingerstick blood sugars, sliding scale insulin, proton     pump inhibitor, and we will also restart and/or continue the Keppra     and a Depakote.  I have spent 45 minutes on this admission.          ______________________________ Fran Lowes, DO     AS/MEDQ  D:  07/27/2010  T:  07/28/2010  Job:  [id number]  cc:   Maxwell Caul, M.D.  Electronically Signed by Fran Lowes DO on 07/28/2010 06:08:21 PM

## 2010-07-28 NOTE — Consult Note (Signed)
NAME:  [name] [name], [name] [name]             ACCOUNT NO.:  [id number]  MEDICAL RECORD NO.:  [id number]           PATIENT TYPE:  I  LOCATION:  3103                         FACILITY:  [facility]  PHYSICIAN:  Levie Heritage, MD       DATE OF BIRTH:  Oct 19, 1950  DATE OF CONSULTATION:  07/27/2010 DATE OF DISCHARGE:                                CONSULTATION   REFERRING PHYSICIAN:  AVA SWAYZE, DO  REASON FOR CONSULTATION:  Seizures.  CHIEF COMPLAINT:  Multiple seizures.  NOTE:  The history is obtained by talking to the ICU team, nursing staff, and reviewing the electronic medical record about the patient and is summarized as follows.  The patient is unable to give the details of his own history at this time.  HISTORY OF PRESENT ILLNESS:  This patient is a 60 year old man with known history of seizures disorder since 2001.  Apparently, this started after traumatic subarachnoid hemorrhage, and he gets frequent visits because of the seizures due to intracranial pathology, as well as alcohol withdrawal, and medications noncompliance. Reportedly today, the patient came to the ER after having two to three generalized seizures.  While in the emergency department, when he was being admitted, he had another seizure of prolonged length and the Neurology consult was called when the patient was shifted to the ICU admission. I have seen the patient having a seizure myself and the semiology is that he will have the tonic extended posturing of the right arm with tonic flexion of the left elbow.  Head will turn to the left side tonically, and he had tonic eyes deviation to the left side.  This lasted for few seconds but happened again within 5 minutes without patient being back to his normal mental status.  Reportedly, this is being going on since his coming from the ER upstairs to the ICU. I have practically seen the patient coming out of these events when I had given him 2 mg of Ativan IV along with the load  of Cerebyx IV 20 mg/kg basis.  PAST MEDICAL HISTORY:  Seizure disorder since 2001 with multiple breakthrough seizures in the setting of alcoholic abuse and medication noncompliance.  Seizure started after posttraumatic subarachnoid hemorrhage leading to subdural hematoma.  He did have some ischemic infarctions as well.  He has diabetes mellitus, hypertension, dementia, alcohol dependence and abuse.  FAMILY HISTORY:  Sister has colon cancer.  SOCIAL HISTORY:  He is married, lives with his wife.  He is a smoker and heavy alcohol drinker.  ALLERGIES:  No known drug allergies.  RECENT LIST OF MEDICATIONS:  The patient is unable to provide any details of if he is taking anything currently, however, in the hospital of dictation on May 16, 2009, he has been taking following medications: 1. Clonidine 0.1 mg twice a day. 2. Nicotine patches. 3. Protonix 40 mg daily. 4. Creon one tablet three times with meals. 5. Norvasc 5 mg twice daily. 6. Zofran p.r.n. basis. 7. Aricept 10 mg daily. 8. Depakote 125 mg daily. 9. Folate 1 mg daily. 10.Hydrocortone on p.r.n. basis. 11.Keppra 1500 mg twice daily. 12.Magnesium oxide 400 mg daily. 13.Mirtazapine  15 mg at bedtime. 14.Multivitamin tablets daily. 15.Namenda 5 mg twice a day. 16.Systane in both eyes as needed. 17.Thiamine 100 mg daily. 18.Venlafaxine XR 75 mg daily.  How much of these medications the patient is being actually taking is not clear, so will be confirmed with him once he comes back to his baseline.  REVIEW OF SYSTEMS:  Currently not possible because of his altered mental status and his postictal confusion.  REVIEW OF CLINICAL DATA:  I have reviewed the patient's CT scan of the head performed today and I have noted no acute intracranial finding.  I do see previous infarcts.  The toxicology screen performed this admission suggests benzodiazepine positive but otherwise negative.  The alcohol level is less than 11.   Depakote level is less than 10.  PHYSICAL EXAMINATION:  Currently, the patient is comfortably lying on the bed with vitals, blood pressure 157/93 mmHg and temperature 98.2 degrees Fahrenheit, pulse is 82 per minute. I have seen one of his convulsion and I have seen and have heard a minimal ictal cry with tonic stiffening of the right arm and extended posturing of the left arm, flexed at the elbow mildly yet tonic.  The head was deviated tonically to the left side and the eyes were tonically deviated to the left side as well.  I do not see any clonic jerking. This status broke after injecting 2 mg of Ativan at bedside, and I have noticed that myself. Months of out of his above explained seizure, he was confused and will not engage in any discussion. He would grimace and appropriately point to the painful stimuli. Bilateral reactive pupils and I have seen him moving his eyes in all directions.  However, he is completely averbal at least at this time. I do not see any clear facial asymmetry; however, there is no effort from the patient's side on a proper examination. I have seen him moving all his four limbs to painful stimuli and spontaneously as well suggesting that he is mainly confused. Gait was deferred.  IMPRESSION:  This is a 60 year old man with known seizure disorder since traumatic subdural hematoma in 2001.  He has had multiple admissions because of seizures provocation due to medication noncompliance and alcohol withdrawal/delirium tremens. Currently, I think the patient was in convulsive status epilepticus which has broken clinically at least and he does seem to be somewhat responsive  now to the painful stimuli suggesting postictal confusion rather than any active nonconvulsive epileptic situation. His seizures semiology suggests the possibility of right frontal lobe epilepsy, and EEG will help further to evaluate.  PLAN:  Currently, I have given the patient 2 mg of  Ativan at the bedside and I am going to load him with 20 mg/kg of fosphenytoin IV.  Please send the load 2 hours after the IV fosphenytoin is finished. The maintenance dose from tomorrow morning will be 5 mg/kg per day basis that can be divided as three times a day and can be used orally or IV at the same dose. The calculated for him will be 100 mg three times a day. Currently, I would also suggest continuing his Keppra 1500 mg twice on a daily basis as he was supposed to take as an outpatient. The low level of alcohol may point towards the possibility of delirium, so he will need close observation for that as well. I will also suggest repeating his chest x-ray in the morning and keeping an eye on the possibility of aspiration. Please get the  patient's EEG for evaluation as the possibility of a focal right frontal epilepsy seems likely in this setting. If his mentation does not comes back to baseline, he will also need the MRI of the brain. I will follow the patient up with you and proceed as the case progresses.          ______________________________ Levie Heritage, MD     WS/MEDQ  D:  07/27/2010  T:  07/28/2010  Job:  [id number]  Electronically Signed by Levie Heritage MD on 07/28/2010 07:26:56 AM

## 2010-07-29 LAB — GLUCOSE, CAPILLARY
Glucose-Capillary: 107 mg/dL — ABNORMAL HIGH (ref 70–99)
Glucose-Capillary: 118 mg/dL — ABNORMAL HIGH (ref 70–99)
Glucose-Capillary: 124 mg/dL — ABNORMAL HIGH (ref 70–99)
Glucose-Capillary: 138 mg/dL — ABNORMAL HIGH (ref 70–99)
Glucose-Capillary: 96 mg/dL (ref 70–99)

## 2010-07-29 LAB — BASIC METABOLIC PANEL
Calcium: 8.7 mg/dL (ref 8.4–10.5)
GFR calc Af Amer: >60 mL/min (ref >60)
GFR calc non Af Amer: >60 mL/min (ref >60)
Sodium: 139 mEq/L (ref 135–145)

## 2010-07-29 LAB — CBC
MCHC: 32.2 g/dL (ref 30.0–36.0)
Platelets: 159 10*3/uL (ref 150–400)
RDW: 14.1 % (ref 11.5–15.5)
WBC: 7.1 10*3/uL (ref 4.0–10.5)

## 2010-07-29 NOTE — Procedures (Unsigned)
REFERRING PHYSICIAN:  Dr. Azucena Cecil.  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  MEDICATIONS:  Ativan, phenytoin, Keppra, p.r.n. basis of Valium, NovoLog, Lovenox, aspirin, Atrovent, NicoDerm, Protonix, clonidine, Norvasc, Aricept, Namenda, Effexor.  EEG DURATION:  This is 21.5 minutes of EEG recording.  EEG DESCRIPTION:  This is an routine 18-channel adult EEG recording with 1 channel devoted to limited EKG recording.  Activation procedure was performed during the photic stimulation and the studies performed in awake state. As the EEG opens up, I do not see any clear location for a posterior dominant rhythm.  However the background rhythm is asymmetrical as well with high amplitude generalized theta range slowing in the right hemisphere along with random spikes embedded.  The embedded spikes are not associated with slow wave complexes and the spikes are not associated with the slow wave component and are more prominent in the right frontal convexity mainly around F4 electrode. There is no generalization of that epileptiform discharge activity towards the left hemisphere where the background rhythm seems in theta range low amplitude of up to 15 microvolts maximum.  Throughout the study many times in between the patient was noted to have purposeful movements as well which are documented by the technician and requesting for watching TV and talking few things.  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  No driving was noted with the photic stimulation in the posterior leads.  I do not see any  sleep architecture either during the study and the study was performed in awake state.  EEG INTERPRETATION:  This is an abnormal EEG due to; 1. 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 2. This EEG reflects generalized slowing that is reactive. 3. The epileptiform activity does not seem to currently generalize or     cause any clinical convulsive activity.  CLINICAL NOTE:  This finding of the right frontal, lateral epileptiform discharges suggest a focus of cortical irritability lies in that location and can be further evaluated with the brain imaging like MRI. This finding of a focal source of the irritable cortical activity also makes him a candidate for EMU (epilepsy monitoring unit) evaluation if a surgical option is to be considered in future for the control of his seizures in case the medications do not work.  This degree of generalized slowing also suggest a nonspecific encephalopathy and can be seen in a variety of causes, which in his case seems to be the postictal confusion.          ______________________________ Levie Heritage, MD    ZO:XWRU D:  07/28/2010 16:23:51  T:  07/29/2010 04:09:48  Job #:  [id number]

## 2010-07-30 LAB — GLUCOSE, CAPILLARY
Glucose-Capillary: 118 mg/dL — ABNORMAL HIGH (ref 70–99)
Glucose-Capillary: 107 mg/dL — ABNORMAL HIGH (ref 70–99)

## 2010-08-09 NOTE — Procedures (Signed)
EEG NUMBER:  [id number].   He is referred by the emergency room physician, Dr. Karma Ganja, on Aug 22, 1999.  When the patient arrived at the emergency room, found in a  generalized convulsions.  He was then by CT scan diagnosed with an  intraparenchymal bleed.  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  He also contracted a traumatic bleed, subarachnoid and subdural.  These  2 components are not present today.  This is strictly in  intraparenchymal bleed in the left occipital lobe.  The patient is very  drowsy, obtunded, and he is a right-handed individual, and according to  his family a heavy drinker.   He is currently on the medications Dilantin, Reglan, Phenergan,  labetalol, and since he could not take p.o. medication on the first day  was changed from Librium to an ativan taper to prevent alcohol  withdrawal seizures.  Neither hyperventilation nor photic stimulation  could be performed.   This only 60 year old gentleman appears much older than his numeric age  and has a poor physical and dental status.  He appears well  malnourished.  His posterior-dominant rhythm for this drowsy described  individual is only about 6 Hz and he is moving frequently in spite of  instructions not to do so which makes the recording less valid.  There  is occasional fifth phase reversal seen over the parietal 4 channel, and  as we switched to a referential montage there were no P4 localized high  amplitudes noted.  Review in a double banana montage basically reveals a  very drowsy individual with a dominant posterior background rhythm that  may well be slower than if the patient would be more alert.   Abnormal EEG  1)Generalized slowing - The fastest background rhythm was 7 Hz and is  better expressed on the right than on the left hemisphere which may be  an effect of the left hemisphere bleeding.  2)Focal abnormalities- A seizure  activity was not noticed, but given the  asymmetry there is an assumption that this gentleman could have seizures  from the bleed as an irritable focus which would be localized around P4  and T6.      [name] [name], M.D.  Electronically Signed     ZO:XWRU  D:  08/23/2008 09:12:17  T:  08/23/2008 23:56:59  Job #:  [id number]   cc:   [name] [name], M.D.  Fax: [phone]   [name] [name], M.D.  Fax: [phone]

## 2010-08-09 NOTE — Consult Note (Signed)
NAME:  [name] [name], [name] [name]             ACCOUNT NO.:  [id number]   MEDICAL RECORD NO.:  [id number]          PATIENT TYPE:  INP   LOCATION:  3023                         FACILITY:  [facility]   PHYSICIAN:  Noel Christmas, MD    DATE OF BIRTH:  04-02-1950   DATE OF CONSULTATION:  09/06/2008  DATE OF DISCHARGE:                                 CONSULTATION   REASON FOR CONSULTATION:  Rule out recurrent stroke.   HISTORY OF PRESENT ILLNESS:  This is a 60 year old man who was  discharged from [facility] on August 25, 2008 following acute  hemorrhagic right parieto-occipital stroke.  The patient was discovered  by family members yesterday to have new onset of weakness on his left  side as well as slurred speech and confusion.  He was taken to [facility] initially for evaluation.  CT reportedly was  unremarkable.  He was subsequently transferred to [facility]  for further care.  The patient has known seizure disorder.  There is no  history of seizure activity since discharge.  He has been on Dilantin  300 mg per day.  Dilantin level on evaluation at [facility] was 2.5 (normal equals 10.0 to 20.0).  Repeat level of Dilantin  today following intravenous load was 15.8.  MRI study earlier today  along with CT scan showed findings consistent with an acute right  thalamic ischemic infarction.  There were changes also in the right  parieto-occipital region consistent with normal evolution of subacute  hemorrhage.  The patient also had hyponatremia on admission with serum  sodium of 129.  Serum glucose was 170.  There was no previous history of  diabetes mellitus apparently.  Family denies alcohol intake since  discharge 2 weeks ago.  The patient's MRA showed no signs of significant  occlusive intracranial abnormality.  Carotids on evaluation during his  recent admission showed no signs of significant carotid artery occlusive  disease.  Multiple old lacunar  type infarctions were noted on MRI and CT  as well as atrophy, somewhat disproportionate to the patient's actual  age.   PAST MEDICAL HISTORY:  Remarkable for hemorrhagic stroke x2.  The  patient has remote history of hemorrhagic stroke in 2002 in addition to  his hemorrhagic stroke 2 weeks ago.  The patient also has a history of  hypertension, possible new onset diabetes mellitus and seizure disorder.  There is a history of alcohol and tobacco abuse as well.   CURRENT MEDICATIONS:  1. Dilantin 300 mg at bedtime.  2. Hydrochlorothiazide 25 mg per day.  3. Ramipril 5 mg per day.   PHYSICAL EXAMINATION:  VITAL SIGNS:  The patient appeared to be slender,  almost cachectic-appearing middle-aged [place] American man who was  slightly lethargic but easy to arouse.  He was confused and clearly  neglected his left side.  He followed commands only fairly well.  He was  slightly agitated.  HEENT:  His pupils were equal, reactive normally to light.  His  extraocular movements were full.  He tended to have difficulty with gaze  fixation and tracking.  He has dense left visual field defect.  There  was mild left facial weakness.  Speech was slightly slurred.  He had  mild weakness in his left upper extremity proximally and distally.  He  had good strength of his left lower extremity including proximally.  Strength of his right extremities was normal.  Deep tendon reflexes were  1+ and symmetrical.  He had a left Babinski sign.  There was sensory  neglect to tactile stimulation over the left half of his body.  There  was no carotid bruit on either side.   CLINICAL IMPRESSION:  1. Acute right ischemic thalamic infarction, most likely of small      vessel origin and probably related to longstanding hypertension.  2. Evolving right parieto-occipital hemorrhagic infarction with      changes consistent with actual age of hemorrhage.  3. Remote history of hemorrhagic stroke (2002).  4. Chronic  seizure disorder with poor compliance with treatment.  5. History of alcohol and tobacco abuse.  6. Cannot rule out early multi-infarct dementia.   RECOMMENDATIONS:  1. Aspirin 81 mg per day.  2. Continue Dilantin 300 mg per day.  3. Rehab intervention with physical therapy, occupational therapy and      speech and language pathology consults.   Thank you for asking me to evaluate Mr. Edmister.      Noel Christmas, MD  Electronically Signed     CS/MEDQ  D:  09/06/2008  T:  09/06/2008  Job:  [id number]

## 2010-08-09 NOTE — Discharge Summary (Signed)
NAME:  [name] [name], [name] [name]             ACCOUNT NO.:  [id number]   MEDICAL RECORD NO.:  [id number]          PATIENT TYPE:  INP   LOCATION:  3023                         FACILITY:  [facility]   PHYSICIAN:  Elliot Cousin, M.D.    DATE OF BIRTH:  05/05/1950   DATE OF ADMISSION:  09/05/2008  DATE OF DISCHARGE:  09/11/2008                               DISCHARGE SUMMARY   DISCHARGE DIAGNOSES:  1. Acute right ischemic thalamic infarction and evolving right      parietal occipital hemorrhagic infarction with changes consistent      with actual age of hemorrhage.  2. Remote history of hemorrhagic stroke in 2002.  3. Chronic seizure disorder, on Dilantin therapy.  4. Delirium, etiology multifactorial including possibly multi-infarct      dementia and associated alcohol withdrawal.  5. Alcohol abuse.  6. Dysarthria and left-sided hemiparesis secondary to the acute      stroke.  7. Malignant hypertension.  8. Tobacco abuse.   DISCHARGE MEDICATIONS:  1. Aspirin 81 mg daily.  2. Folic acid 1 mg daily.  3. Haldol 0.5 mg daily.  4. Labetalol 100 mg b.i.d.  5. Multivitamin once daily.  6. Nicotine patch 21 mg daily for 1 week and then 14 mg daily for 1      week and then 7 mg daily for 1 week and then stop.  7. Protonix 40 mg daily.  8. Dilantin  300 mg at bedtime.  9. Altace 2.5 mg daily.  10.Thiamine 100 mg daily.  11.Trazodone 25 mg q.h.s.   DISCHARGE DISPOSITION:  The patient is currently stable and in improved  condition.  The plan is to discharge him to [facility] today.   CONSULTATIONS:  1. Neurologists, Dr. Pearlean Brownie and Dr. Roseanne Reno.   PROCEDURES PERFORMED:  1. MRA of the head and MRI of the brain.  The results revealed 12 x 20-      mm right occipital parenchymal hemorrhage, acute and subacute right      thalamic ischemia, extensive atrophy and small vessel disease, and      a constellation of other findings related to hypertensive      cerebrovascular  disease.  Gross patency of the carotid and basilar      arteries.  Intracranial atherosclerotic disease appears to affect      the proximal left ACA and MCA segments.  2. CT scan of the head on September 06, 2008.  The results revealed ovoid,      13 x 16-mm, right occipital subacute hemorrhage, slight      evolutionary change compared with the CT at [facility],      extensive chronic microvascular ischemic change associated with      premature moderate atrophy, and early cytotoxic edema in the right      thalamus consistent with acute infarction.   HISTORY OF PRESENT ILLNESS:  The patient is a 60 year old man with a  past medical history significant for a recent acute hemorrhagic stroke  in May of 2010, alcohol abuse, seizure disorder, and malignant  hypertension.  He presented to the hospital at  [facility] for a chief complaint of worsening confusion, difficulty  speaking, slurred speech, and increased weakness on his left side.  Apparently the CT scan of the head at [facility]  revealed no acute findings.  However, when he presented to [facility], the followup CT scan of the head revealed changes consistent  with an acute right thalamic stroke and evolving right occipital  hemorrhagic stroke.  The patient was therefore admitted for further  evaluation and management.  Of note, the patient's family preferred that  the patient be transferred to [facility] for further evaluation  and in essence that was the reason why the patient was transferred to  [facility].   For additional details, please see the dictated history and physical.   HOSPITAL COURSE:  1. ACUTE RIGHT THALAMIC STROKE AND EVOLUTIONARY CHANGES OF THE RIGHT      PARIETAL-OCCIPITAL HEMORRHAGIC STROKE IN MAY OF 2010.  The patient      was evaluated by neurologist, Dr. Roseanne Reno, initially.  Dr. Roseanne Reno      recommended aspirin therapy at 81 mg daily and  continued treatment      with Dilantin for his seizure disorder.  In addition, he advised      consultations with the physical, occupational, and speech      therapists.  The speech therapist evaluated the patient and      recommended a dysphagia-3 with thin liquid diet.  Both the physical      and occupational therapists evaluated the patient and recommended      ongoing strengthening and further rehabilitation in a skilled      setting.  These recommendations were discussed with the patient's      wife, [name].  She was in total agreement with short-term      skilled nursing facility placement.  The patient initially refused      placement; however, he has agreed today.  He is currently alert and      oriented although he had been somewhat agitated and delirious for      the past 2 to 3 days.  He does have a mild left-sided facial droop      and mild left-sided hemiparesis.  2. MALIGNANT HYPERTENSION.  The patient's strokes have been secondary      to malignant and uncontrolled hypertension.  His chronic      antihypertensive medications, which included ramipril at 2.5 mg      daily, was continued; however, the hydrochlorothiazide was      discontinued.  The patient's blood pressures became uncontrolled at      times with his systolic blood pressure greater than 190 and his      diastolic pressure greater than 100.  He was therefore started on      labetalol.  Over the past 24 hours, there has been an improvement      in his blood pressures.  The patient will be discharged to the      skilled nursing facility on ramipril and labetalol.  Of note, his      elevated blood pressures may have been precipitated in part by      delirium and alcohol withdrawal.  3. 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  The patient became very      agitated, confused, and uncooperative on hospital day #2.  The  followup neurological  consultation was provided by Dr. Pearlean Brownie who      promptly started the phenobarbital withdrawal protocol.  In      addition, the patient was started on thiamine, folic acid, and a      multivitamin once daily.  Following the start of the phenobarbital      taper, there was some improvement in his symptoms, however,      additional Ativan had to be added.  Subsequently, trazodone and      Haldol were added for sundowning.  Overnight and this morning, the      patient was much more alert and oriented and very cooperative.  He      has agreed to skilled nursing facility placement.  The      phenobarbital taper has been discontinued.  He will be maintained      on vitamin therapy, trazodone, and Haldol.  4. SEIZURE DISORDER.  The patient was maintained on Dilantin.  There      was no seizure activity during the hospital course.  5. ALCOHOL AND TOBACCO ABUSE.  As above, the patient was started on      vitamin therapy.  Nicotine replacement therapy was started as well.      The patient was strongly advised to stop smoking and to stop      drinking.      Elliot Cousin, M.D.  Electronically Signed     DF/MEDQ  D:  09/11/2008  T:  09/11/2008  Job:  [id number]

## 2010-08-09 NOTE — Discharge Summary (Signed)
NAME:  [name] [name], [name] [name]             ACCOUNT NO.:  [id number]   MEDICAL RECORD NO.:  [id number]          PATIENT TYPE:  INP   LOCATION:  3038                         FACILITY:  [facility]   PHYSICIAN:  Pramod P. Pearlean Brownie, MD    DATE OF BIRTH:  1950/06/30   DATE OF ADMISSION:  08/21/2008  DATE OF DISCHARGE:  08/25/2008                               DISCHARGE SUMMARY   DISCHARGE DIAGNOSES:  1. Right parietooccipital hemorrhage secondary to hypertension.  2. Hypertension.  3. Seizure disorder.  4. History of intracerebral hemorrhage.  5. Heavy alcohol abuse.  6. Tobacco abuse.  7. History of noncompliance.   DISCHARGE MEDICINES:  1. Dilantin 300 mg nightly.  2. Hydrochlorothiazide 25 mg daily.  3. Altace 5 mg daily.   STUDIES PERFORMED:  1. CT of the brain on admission shows 1.6-cm parenchymal hemorrhage in      the right occipital lobe and posterior parietal lobe.  No midline      shift.  There is advanced chronic small vessel disease and      bilateral basal ganglia lacunar infarct.  2. CT of the head at 24 hours shows no change in hemorrhage.  3. CT angio of the head and neck again showed no change in hemorrhage.      There is also no significant proximal stenosis, aneurysm, or branch      vessel occlusion.  No vascular abnormality associated with current      hemorrhage.  There is moderate spondylosis of the cervical spine.  4. EKG shows sinus tachycardia, bilateral abnormalities, left      ventricular hypertrophy.   LABORATORY STUDIES:  Chemistry with glucose 102, with a potassium of  2.7, it was treated and went up to 3.5.  Serum osmolality 285, lipase  normal, amylase normal.  Urine drug screen positive for benzos.  Urinalysis, 0-2 white blood cells, 0-2 red blood cells, rare epithelial,  alcohol less than 5.  Coags on admission with PTT 23, otherwise normal.  CBC with platelets 178, RDW 15.9.   HISTORY OF PRESENT ILLNESS:  [name] [name] is a 60 year old  African American  male with a history of intracerebral hemorrhage in 2002  and seizure disorder.  He was admitted in the past by Dr. Channing Mutters and in the  ICU for 3 weeks secondary to his hemorrhage.  He was discharged on  antiepileptics, but his wife states he took those for only 2-3 weeks and  has not taken them sent.  The morning of admission, the patient found  him sleeping longer than usual.  She then found him to be actively  seizing.  He was brought by EMS to the [facility] Emergency Department  where CT of the head showed a small right parietooccipital hypodensity  consistent with intracerebral hemorrhage.  The patient has had 3 other  witnessed seizures lasting around 30 seconds.  The patient has altered  mental status likely secondary to that.  Dr. Yetta Barre, neurosurgeon in the  emergency room believed he was postictal.  Stroke Service was consulted  and Dr. Vickey Huger then saw the patient.  She admitted him to  the hospital  for further evaluation.  Of note, the patient's blood pressure was  greater than 230 on admission and was given IV labetalol.   HOSPITAL COURSE:  The patient's hemorrhage remained stable over the  course of the next 5 days.  Blood pressure remained elevated in the  200s/100s, receiving significant amounts of p.r.n. labetalol.  The  patient will be started on hydrochlorothiazide and Altace at the time of  discharge.  If his blood pressure drops less than 170 systolic 2 hours  after taking the medicine, he can be discharged home.  Of note, he does  have a history of intracerebral hemorrhage in 2002 with poor medical  compliance following.  The patient has been advised to stop smoking and  to decrease his alcohol consumption to decrease his risk for stroke.  Neurologically, he has significantly improved with no significant  neurologic deficits.  PT did recommend home health physical therapy and  speech therapy and will order, but unsure if he will be compliant.  CT  angio of the head was  done to rule out aneurysmal or other etiology of  hemorrhage.  It is felt that the hemorrhage is secondary to  hypertension.  He will be discharged home also on Dilantin as prior to  admission as he has history of seizures and had seizure on admission.   CONDITION ON DISCHARGE:  The patient is alert and oriented x3.  No  aphasia.  No dysarthria.  Eye movements are full.  Face is symmetric.  Tongue is midline.  He has no drift in his extremities.   DISCHARGE/PLAN:  1. Give Altace and hydrochlorothiazide prior to discharge.  If blood      pressure dropped to less than 170 within 2 hours, he may be      discharged home.  The patient has been advised.  Unsure he will be      compliant with this.  2. Outpatient PT and speech therapy.  3. Get a primary care physician and follow up within 1 month.  4. Follow up with Dr. Delia Heady within 2-3 months.      Annie Main, N.P.    ______________________________  Sunny Schlein. Pearlean Brownie, MD    SB/MEDQ  D:  08/25/2008  T:  08/26/2008  Job:  [id number]   cc:   Dr. Marikay Alar

## 2010-08-09 NOTE — Consult Note (Signed)
NAME:  [name] [name], ALIANO             ACCOUNT NO.:  [id number]   MEDICAL RECORD NO.:  [id number]          PATIENT TYPE:  INP   LOCATION:  3112                         FACILITY:  [facility]   PHYSICIAN:  Tia Alert, MD     DATE OF BIRTH:  04-15-1950   DATE OF CONSULTATION:  08/21/2008  DATE OF DISCHARGE:                                 CONSULTATION   TIME:  11:45 a.m.   CHIEF COMPLAINT:  Intracerebral hemorrhage.   PRESENT ILLNESS:  Mr. Mcknight is a 60 year old gentleman with a  history of intracerebral hemorrhage in 2002 and seizure disorder.  He  was admitted in the past by Dr. Channing Mutters and remained in the ICU for 3 weeks  with intracerebral hemorrhage.  He was discharged with antiepileptics,  but the wife states he took those for only 2-3 weeks and has not taken  them since.  She found him to be sleeping longer than usual this morning  and then found him to be actively seizing.  The patient was brought by  EMS to the [facility] Emergency Department where head CT showed a very  small right parieto-occipital hyperdensity consistent with intracerebral  hemorrhage.  The patient had 3 other witnessed seizures lasting around  30 seconds a piece and has altered mental status secondary to this.  I  believe he is postictal.   PAST MEDICAL HISTORY:  1. Seizure disorder.  2. Hypertension.  3. Intracerebral hemorrhage.  4. Heavy alcohol use.  5. Tobacco abuse.   ALLERGIES:  No known drug allergies.   MEDICATIONS:  Unknown at this point as the patient is unable to provide.   SOCIAL HISTORY:  Heavy smoker, heavy drinker.   PHYSICAL EXAMINATION:  VITAL SIGNS:  Blood pressure 143/92, pulse 91,  and respirations 16.  GENERAL:  A lethargic black male lying in a stretcher.  He is easily  arousable and becomes somewhat agitated and combative, not much in the  way verbalization at this point.  NEUROLOGIC:  His pupils are reactive.  His gaze is conjugate.  There is  no facial asymmetry.  He seems  to move all extremities equally.  I  cannot test his visual fields because I cannot get him to cooperate with  the physical exam.  Gait is not tested.  Cannot test coordination or  balance or sensation adequately because of his mental status.   CT scan, I have reviewed as well as report.  There is a small right  parieto-occipital hyperdensity consistent with a 1.5-cm intracerebral  hemorrhage without significant mass effect or edema.  Basal cisterns are  open.  There is no shift.   ASSESSMENT AND PLAN:  A 60 year old gentleman with a small intracerebral  hemorrhage likely related to his hypertension and alcohol use.  He came  in with a seizure today.  This is a nonsurgical hemorrhage.  I believe  this patient is best treated by being admitted to the Neurology Service  or with the Stroke Service where they can not only treat his  intracerebral hemorrhage but control his blood pressure and control his  seizures and educate him  on the importance of compliance with  antiepileptics and with treatment of his hypertension.  I would repeat  the CT scan in the morning just to make sure that it does not progress  or enlarge, but at this point, this does not appear to be a surgical  lesion.  I will follow along while he is in the hospital.  I will also  check his coagulation status and his PT was 13.5 with an INR of 1.0 with  a platelet count of 178.  Again, I think he is best treated on the  Neurology Service with me following the consult.      Tia Alert, MD  Electronically Signed     DSJ/MEDQ  D:  08/21/2008  T:  08/22/2008  Job:  [id number]

## 2010-08-09 NOTE — H&P (Signed)
NAME:  [name] [name], [name] [name]             ACCOUNT NO.:  [id number]   MEDICAL RECORD NO.:  [id number]          PATIENT TYPE:  INP   LOCATION:  3023                         FACILITY:  [facility]   PHYSICIAN:  Della Goo, M.D. DATE OF BIRTH:  08/10/1950   DATE OF ADMISSION:  09/06/2008  DATE OF DISCHARGE:                              HISTORY & PHYSICAL   CHIEF COMPLAINT:  Slurred speech, left-sided weakness.   HISTORY OF PRESENT ILLNESS:  This is a 60 year old male who was taken to  the [facility] emergently after beginning to  suffer symptoms of worsening confusion, difficulty speaking, slurred  speech and increased weakness on his left side.  The patient is unable  to give the history and his daughter is at bedside and gives the history  and reports the symptoms started at about 3 p.m. when he was complaining  of having a headache.  As time went on he began to have increased  confusion and weakness and then began to have difficulty standing and  walking..  They called emergency medical services and brought the  patient to the [facility] and at the time of presentation  this had been about 5 hours into his event.  Of note the patient was  recently admitted to the [facility] 2-1/2 weeks ago with a  recent cerebrovascular accident with similar symptoms.  The patient was  evaluated at the emergency department at [facility] and  underwent a CT scan which did not reveal any acute findings.  The  patient's family preferred for the patient to have a neurology  consultation and requested to be transferred to [facility] for  further evaluation and treatment and for a neurology consultation which  apparently are not offered at [facility].   PAST MEDICAL HISTORY:  Significant for the recent cerebrovascular  accident, hypertension, seizure disorder, tobacco abuse, past history of  alcohol usage.   CURRENT MEDICATIONS:   His medications at this time include ramipril 5 mg  1 p.o. daily, omeprazole 1 p.o. daily, phenytoin, EX 100 mg 3 tablets  p.o. q.h.s., HCTZ 25 mg p.o. q.a.m.   ALLERGIES:  No known drug allergies.   SOCIAL HISTORY:  The patient is a smoker, but the patient has cut down  to 2 cigarettes daily since his recent cerebrovascular accident.  The  patient also has quit drinking since the episode.   FAMILY HISTORY:  Unable to obtain.   REVIEW OF SYSTEMS:  Pertinent as mentioned above.  All other systems are  negative.   PHYSICAL EXAMINATION FINDINGS:  GENERAL APPEARANCE:  This is a thin,  well-developed 60 year old male in discomfort but no acute distress.  VITAL SIGNS:  Temperature 98.2, blood pressure 155/86, heart rate 66,  respirations 20, O2 saturations 97% on 2 liters.  HEENT: Normocephalic, atraumatic.  There is left-sided facial drooping  and twisting of the mouth.  Pupils are equally round, reactive to light.  Extraocular movements are intact.  There is some hesitancy with leftward  gaze.  Funduscopics are benign.  Nares are patent bilaterally.  Oropharynx is clear.  Unable to appreciate tongue deviation at this  time.  Neck is supple.  No thyromegaly, adenopathy or jugular venous  distention.  CARDIOVASCULAR: Regular rate and rhythm.  No murmurs, gallops or rubs.  LUNGS: Clear to auscultation bilaterally.  ABDOMEN: Positive bowel sounds, soft, nontender, nondistended.  EXTREMITIES: Without cyanosis, clubbing or edema.  NEUROLOGIC:  The patient has left-sided hemiparesis and has a positive  Babinski sign on the left foot.   Laboratory studies performed at the [facility],  white blood cell count 11.2, hemoglobin 15.6, hematocrit 46.7, platelets  314, MCV 100. Sodium 129, potassium 3.9, chloride 93, CO2 of 28, BUN 22,  creatinine 1.11 and glucose 170. Total bilirubin 0.2, alkaline  phosphatase 113, ALT 68, AST 30.  CT scan of the head reveals no acute   intracranial abnormality. Diffuse cerebellar and cerebral atrophy are  seen.  There is an old lacunar infarction in the anterior aspect of the  right basal ganglion which is seen.  EKG performed reveals a normal  sinus rhythm but also reveals PVCs and PACs but no acute ST-segment  changes.  Pro time 11.5, INR 0.9 and PTT 25.8.  Dilantin level 2.5.   ASSESSMENT:  A 60 year old male being admitted with  1. Dysarthria and left-sided hemiparesis which could be progression of      his previous cerebrovascular accident versus a transient ischemic      attack.  2. Hypertension.  3. Seizure disorder.  4. Subtherapeutic Dilantin level.  5. Hyponatremia.Marland Kitchen   PLAN:  The patient will be admitted to the neuro unit and placed on  telemetry for cardiac monitoring.  Cardiac enzymes will be performed an  MRI, MRA study of the brain has been ordered and a neurology  consultation has also been placed.  The patient will be placed on  neurologic checks.  The patient is n.p.o. since he failed his bedside  swallow evaluation.  A formal swallowing evaluation will be performed by  speech therapy.  Physical therapy and occupational therapy have also  been consulted.  The patient will be placed on p.r.n. medication for  elevated blood pressure and has been placed on IV fluids for maintenance  at this time.  The patient will be placed on GI prophylaxis with IV  Protonix and SCDs have been ordered for DVT prophylaxis.  Further workup  will ensue pending results of the patient's clinical course and results  of his studies.  At this time his previous medical records cannot be  retrieved from the e-chart system. Possibly this patient has another  account number.      Della Goo, M.D.  Electronically Signed     HJ/MEDQ  D:  09/06/2008  T:  09/06/2008  Job:  [id number]

## 2010-08-09 NOTE — Consult Note (Signed)
NAME:  [name] [name], [name] [name]             ACCOUNT NO.:  [id number]   MEDICAL RECORD NO.:  [id number]          PATIENT TYPE:  INP   LOCATION:  3112                         FACILITY:  [facility]   PHYSICIAN:  Melvyn Novas, M.D.  DATE OF BIRTH:  Jan 25, 1951   DATE OF CONSULTATION:  DATE OF DISCHARGE:                                 CONSULTATION   This gentleman resides in [place], [place] and receives his  primary care through the [place] [facility]  He has not been seen in the [facility] since 2001.  He was last evaluated in the ER  in 2008 for chest pain and had  bronchitis, received antibiotics and was discharged.  [name] is  a 60 year old right-handed African American gentleman,  who presents with a chief complaint of seizures.   The history was obtained through the wife.  The patient is still  obtunded.  According to her, she found him at about 9:30 perhaps 10 a.m. on the  floor still actively convulsing.  She describes rhythmic movement of the upper extremity.  Her husband's  eyes were open, and she does not state that there was any incontinence  or tongue bite.  He was apparently found on the floor.  She did not hear  a thump, did not see any objects or furniture strewn about the room, yet  the place where he fell is very close to a little table, and she was not  sure if he could have hit his head. No external injuries were noted.  On the way to the ER by EMS, he had a second seizure, received Versed  IV, but was thankfully not intubated.  On the arrival in the ER here, he had a third seizure, but was so drowsy  and obtunded and apparently between the giving of the Versed dose and  the third seizure were less than 3 minutes, so the ER physician wanted  to watch and wait and indeed he has not seized again.  The patient  arrived at 10:40 a.m. here this morning and there is over an hour  unaccounted for the time that he was supposedly  found to the time he  arrived   The patient lives with his wife who describes them as a heavy drinking  active alcoholic.  She was not forthcoming with this assessment, but the  patient  daughters were.  They state that their father drinks 2-3  bottles of wine a day, sometimes has hard liquor and that he is not  eating regularly, seemingly covering his calorie intake from alcohol.  They state that he has had seizures in 2001, at that time attributed to  a fall with a traumatic subarachnoid and subdural hemorrhage treated by  Dr. Channing Mutters and Dr. Newell Coral.  He had been placed by in the then [facility] on phenytoin and he was supposed to take an  antihypertensive as well.  Both medications he discontinued within 14 days of his discharge from  the hospital.  There is only spotty records about his followup care.  He seems not to  have a primary care physician and his wife states clearly that he  refuses to take medication and she can not force him to.  Upon arrival here, his blood pressure was 230 mmHg systolic, it was  quickly reduced after giving him labetalol 160/103, his pulse rate was  still 114, his respiratory rate was 23.  The patient was still severely obtunded.  Each seizure was described as  lasting about 30 seconds.  The wife can only state that she saw him last  normal somewhere around 11 p.m. last night.  She had no conversation  with him prior to finding him this morning seizing.  She states that he  takes Pepcid oral at home, this is the only medication or over-the-  counter drug that he is taking at all.  The head CT showes a bleed, itraparenchymal in the left occipital lobe  and without access to the ventricular sytem.   PAST MEDICAL HISTORY:  1. Hypertension.  2. Noncompliance with medication seizure disorder.  3. Noncompliance with medication bleeds in 2001.  4. Cerebral hemorrhage.  5. Noncompliance with the prescribed seizure medication after  that.  6. Also has multiple lacunar strokes and has not been taking      antiplatelets.   Current smoker within the last 12 months.  There is discrepancy between  how many cigarettes he smokes, probably a pack a day.  He is a very  heavy drinker.  He is married, lives with his wife in [place] in an  apartment.  Adult children, described as healthy.  Illegal drug use was  denied.  The patient's occupation and education level were not elicited.   The patient who supposedly had no meal or food intake since last  midnight, had a blood sugar of 220, repeat Glucometer still tested 199.  A Foley catheter was placed and urinalysis was sent off returning with  proteinuria and glucosuria, but no active infection.   Neurologic evaluation, the patient cannot contribute to the review of  systems and can just marginally answer simple questions.  His vital signs were quoted above.  He is unable to focus his gaze.  He does not follow commands such as  looking to the left, looking to the right, etc.  He was able to stick out his tongue briefly for me, but only because he  yawned, and not because he followed my command.  There was no deviation of the tongue.  He seems to have peripheral  vision loss and did not respond to visual threat from the left.  He was  unable to gaze upwards or downwards.  He did not speak for me, but nodded to his birth date.  He followed simple commands such as squeezing my hands, but then would  not release to command.  He is very skinny, not well groomed, poor dental status.  He could not comply with finger-nose-finger exam.  His deep tendon  reflexes were difficult to obtain because the patient was not able to  relax.  He withdraws with a 3-point withdrawal to plantar stimulation  and has up going toes bilaterally.  Again mental status, he is obtunded, not fully oriented.  He was asked  to repeat 2 words for me, could not do so.  I asked him to the very end  of my  exam once again he could give me his full name and he only  whispered Stpehen, but would not use his last name.     I reviewed the  EKG, I reviewed the CT scan, the laboratories as  currently available.  This gentleman has a white blood cell count of  9.5, platelet count of 178, elevated neutrophils 95%, granulocytes  elevated as well.  No eosinophiles.  No monocytes.  INR was 1.0, so at  least his coagulation function is intact.  PTT was 23 seconds.  The  bleed is 1.5 x 1.8 cm with a small amount of surrounding vasogenic  edema.  We will repeat the CT in 24 hours with contrast to make sure  that we are not missing otherwise underlying pathology.  He has multiple  lacunar infarcts in the basal ganglia bilaterally and he has atrophy of  the brain, but no hydrocephalus.  There is no midline shift.   I suggested that he will remain on 2 L of oxygen nasally and goes to the  3100 floor, if not available 2900 or 3300.  He needs regular neuro  checks .  In case that his status worsens , he may need to be intubated.  The family is not restricting any of the care, wants everything done,  FULL CODE- He was already seen today  by Dr. Yetta Barre, neurosurgeon, who  stated that he is not at this time in need of any neurosurgical  approach.    I have not written for further seizure medications except for regular  100 mg doses of Dilantin to be given IV q.8 h.  I will have the patient be evaluated by Speech Therapy, Occupational  Therapy, Physical Therapy and Rehab Services.    I will also write for an alcohol withdrawal prophylaxis protocol and  an MRI with gadolinium to establish a diffusion weighted image .This  could hould be obtained within a day.  An EEG was also ordered given that a holiday weekend is ahead of [place] and  I would like to have this before we close the laboratory for the next 3  days.  ADmitted to STROKE MD, for intracerebral haemorrhage, ICD 431. and new  onset seizures, times 3- ICD  345.11  Alcohol abuse  untreated hypertension  untreated diabetes  renal impairment with proteinuria and glucosuria.      Melvyn Novas, M.D.  Electronically Signed     CD/MEDQ  D:  08/21/2008  T:  08/22/2008  Job:  [id number]

## 2010-08-11 NOTE — Discharge Summary (Signed)
NAME:  [name] [name], [name] [name]             ACCOUNT NO.:  [id number]  MEDICAL RECORD NO.:  [id number]           PATIENT TYPE:  I  LOCATION:  3021                         FACILITY:  [facility]  PHYSICIAN:  [name] [name], M.D.DATE OF BIRTH:  12-14-1950  DATE OF ADMISSION:  07/27/2010 DATE OF DISCHARGE:  07/30/2010                              DISCHARGE SUMMARY   PRIMARY CARE PHYSICIAN:  Identified physician at [facility].  DISCHARGE DIAGNOSES: 1. Seizure disorder since 2001.     a.     Multiple breakthrough seizures in the setting of alcohol      abuse and medication noncompliance.     b.     Begin after post traumatic subarachnoid hemorrhage leading      to subdural hematoma.     c.     Likely ischemic infarcts contributing to onset as well.     d.     Admit this hospital stay with status epilepticus.     e.     Well-controlled with medication administration during      hospital stay.     f.     Focal area of epileptiform activity appreciated in the right      frontal lateral region which could possibly be amenable to      surgical treatment. 2. Longstanding history of ongoing alcohol abuse. 3. History of right thalamic cerebrovascular accident. 4. History of parieto-occipital cerebrovascular accident. 5. Diet-controlled diabetes mellitus. 6. Dementia, likely related to alcohol abuse plus or minus     Alzheimer's. 7. Hypertension. 8. Gastroesophageal reflux disease. 9. History of intracranial hemorrhage 2002 and 2010 as discussed     above. 10.Medication noncompliance.  DISCHARGE MEDICATIONS: 1. Aspirin 81 mg daily. 2. Clonidine 0.1 mg p.o. b.i.d. 3. Dilantin 100 mg p.o. t.i.d. 4. Namenda 5 mg p.o. b.i.d. 5. Multivitamin one p.o. daily. 6. Keppra 1500 mg p.o. b.i.d. 7. Aricept 10 mg p.o. at bedtime. 8. Creon 6000 units t.i.d. with meals. 9. Folic acid 800 mcg p.o. daily. 10.Magnesium oxide 400 mg daily. 11.Norvasc 5 mg p.o.  daily. 12.Protonix 40 mg p.o. b.i.d. 13.Thiamine 250 mg p.o. daily  CONSULTATIONS:  Neuro hospitalist service.  PROCEDURES: 1. CT scan of head Jul 27, 2010 - atrophy and small-vessel disease.  No     acute intracranial findings.  Multiple areas have remote ischemia. 2. 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  There is evidence of generalized slowing that is     reactive.  Epileptiform activity does not seem to currently     generalized or causing the clinical convulsive activity.  FOLLOWUP: 1. The patient is advised to follow up with his primary care physician     at [facility] within 7-10 days for routine medical recheck. 2. The patient is provided with a number for the [facility] which is [phone].  He is wife advised that  they can contact this clinic to arrange for an evaluation for     potential epilepsy surgery.  HOSPITAL COURSE:  [name] is a 60 year old alcoholic with dementia and a complex medical history as noted above.  He was admitted to hospital on Jul 27, 2010 after he began to suffer refractory seizures. He has a history of the same.  The patient was admitted at acute units. He then experienced status epilepticus.  He required prompt medical attention to arrest his refractory seizure activity.  Further investigation suggested that the patient had been drinking again and had not been taking his seizure medications.  CT scan of the head revealed no acute findings.  Neurology consultation was carried out.  The patient did have an EEG.  The patient's EEG did in fact reveal a epileptiform foci.  It is felt that the patient could consider epilepsy surgery at [facility] should he choose to continue to be noncompliant with medications.   Otherwise, however, it is felt that his seizures were likely be well controlled if he would simply be compliant with medications.  With administration of IV antiepileptics the patient's seizure activity ceased and did not resume.  Throughout remainder of hospital stay mild-to-moderate confusion occurred in a waxing and waning fashion.  As per discussion with the patient's wife this apparently is his baseline.  At the time of this dictation the patient is deemed to be stable for discharge home.  He is encouraged to be strictly compliant with all of his medications.  His wife is advised on the importance of him taking his medications.  She is fully aware of this fact, but is quite convinced that the patient will likely simply resumed drinking and refused to take his medications.  The option of epilepsy surgery, has also been discussed with her and she will consider and discussing it further with him and attempting to convince him to consider this option.  With nothing further to add medically in the acute setting and after clearance from physical therapy the patient was deemed to be stable for discharge home.     [name] [name], M.D.   JTM/MEDQ  D:  07/30/2010  T:  07/30/2010  Job:  [id number]  Electronically Signed by Jetty Duhamel M.D. on 08/11/2010 09:53:32 AM
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
## 2010-08-22 ENCOUNTER — Emergency Department: Payer: Self-pay | Admitting: Emergency Medicine

## 2010-10-15 ENCOUNTER — Inpatient Hospital Stay: Payer: Self-pay | Admitting: Internal Medicine

## 2010-11-03 ENCOUNTER — Emergency Department: Payer: Self-pay | Admitting: Emergency Medicine

## 2010-11-12 ENCOUNTER — Emergency Department: Payer: Self-pay | Admitting: Emergency Medicine

## 2010-11-19 ENCOUNTER — Emergency Department (HOSPITAL_COMMUNITY): Payer: Medicare Other

## 2010-11-19 ENCOUNTER — Encounter: Payer: Self-pay | Admitting: Internal Medicine

## 2010-11-19 ENCOUNTER — Inpatient Hospital Stay (HOSPITAL_COMMUNITY)
Admission: EM | Admit: 2010-11-19 | Discharge: 2010-11-23 | DRG: 101 | Disposition: A | Discharge status: Skilled Nursing Facility | Payer: Medicare Other | Source: Ambulatory Visit | Attending: Internal Medicine | Admitting: Internal Medicine

## 2010-11-19 DIAGNOSIS — I10 Essential (primary) hypertension: Secondary | ICD-10-CM | POA: Diagnosis present

## 2010-11-19 DIAGNOSIS — Z9119 Patient's noncompliance with other medical treatment and regimen: Secondary | ICD-10-CM

## 2010-11-19 DIAGNOSIS — Z8673 Personal history of transient ischemic attack (TIA), and cerebral infarction without residual deficits: Secondary | ICD-10-CM

## 2010-11-19 DIAGNOSIS — G309 Alzheimer's disease, unspecified: Secondary | ICD-10-CM | POA: Diagnosis present

## 2010-11-19 DIAGNOSIS — F0280 Dementia in other diseases classified elsewhere without behavioral disturbance: Secondary | ICD-10-CM | POA: Diagnosis present

## 2010-11-19 DIAGNOSIS — E876 Hypokalemia: Secondary | ICD-10-CM | POA: Diagnosis present

## 2010-11-19 DIAGNOSIS — F1027 Alcohol dependence with alcohol-induced persisting dementia: Secondary | ICD-10-CM | POA: Diagnosis present

## 2010-11-19 DIAGNOSIS — Z91199 Patient's noncompliance with other medical treatment and regimen due to unspecified reason: Secondary | ICD-10-CM

## 2010-11-19 DIAGNOSIS — G40909 Epilepsy, unspecified, not intractable, without status epilepticus: Principal | ICD-10-CM | POA: Diagnosis present

## 2010-11-19 DIAGNOSIS — K219 Gastro-esophageal reflux disease without esophagitis: Secondary | ICD-10-CM | POA: Diagnosis present

## 2010-11-19 DIAGNOSIS — K861 Other chronic pancreatitis: Secondary | ICD-10-CM | POA: Diagnosis present

## 2010-11-19 DIAGNOSIS — F1020 Alcohol dependence, uncomplicated: Secondary | ICD-10-CM | POA: Diagnosis present

## 2010-11-19 DIAGNOSIS — E119 Type 2 diabetes mellitus without complications: Secondary | ICD-10-CM | POA: Diagnosis present

## 2010-11-19 DIAGNOSIS — G8389 Other specified paralytic syndromes: Secondary | ICD-10-CM | POA: Diagnosis present

## 2010-11-19 LAB — URINALYSIS, ROUTINE W REFLEX MICROSCOPIC
Glucose, UA: NEGATIVE mg/dL
Hgb urine dipstick: NEGATIVE
Ketones, ur: 15 mg/dL — AB
Leukocytes, UA: NEGATIVE
pH: 5.5 (ref 5.0–8.0)

## 2010-11-19 LAB — POCT I-STAT, CHEM 8
Calcium, Ion: 1.13 mmol/L (ref 1.12–1.32)
Chloride: 100 mEq/L (ref 96–112)
HCT: 40 % (ref 39.0–52.0)
Potassium: 3.2 mEq/L — ABNORMAL LOW (ref 3.5–5.1)
Sodium: 137 mEq/L (ref 135–145)

## 2010-11-19 LAB — DIFFERENTIAL
Eosinophils Absolute: 0.1 10*3/uL (ref 0.0–0.7)
Lymphs Abs: 1.2 10*3/uL (ref 0.7–4.0)
Neutrophils Relative %: 78 % — ABNORMAL HIGH (ref 43–77)

## 2010-11-19 LAB — CBC
MCV: 86 fL (ref 78.0–100.0)
Platelets: 166 10*3/uL (ref 150–400)
RBC: 4.29 MIL/uL (ref 4.22–5.81)
WBC: 8.6 10*3/uL (ref 4.0–10.5)

## 2010-11-19 LAB — RAPID URINE DRUG SCREEN, HOSP PERFORMED: Opiates: NOT DETECTED

## 2010-11-19 LAB — GLUCOSE, CAPILLARY
Glucose-Capillary: 106 mg/dL — ABNORMAL HIGH (ref 70–99)
Glucose-Capillary: 137 mg/dL — ABNORMAL HIGH (ref 70–99)

## 2010-11-19 LAB — PHENYTOIN LEVEL, TOTAL
Phenytoin Lvl: 2.7 ug/mL — ABNORMAL LOW (ref 10.0–20.0)
Phenytoin Lvl: 18.9 ug/mL (ref 10.0–20.0)

## 2010-11-19 LAB — URINE MICROSCOPIC-ADD ON

## 2010-11-19 LAB — ALBUMIN: Albumin: 3.4 g/dL — ABNORMAL LOW (ref 3.5–5.2)

## 2010-11-19 NOTE — H&P (Signed)
Hospital Admission Note Date: 11/19/2010  Patient name: [name] [name] Medical record number: [id number] Date of birth: [birth year] Age: 60 y.o. Gender: male PCP: No primary provider on file.  Medical Service:Internal Medicine B-1  Attending physician: Dr. Aundria Rud                Pager: Resident (R2/R3):Dr. Gilford Rile     Pager: [phone] Resident (R1): Dr. Milbert Coulter                             Pager: [phone]  Chief Complaint:seizure   History of Present Illness: 60 y/o man with PMH significant for seizure disorder since 2001, medication noncompliance, multiple admissions for breakthrough seizures presented to the ER with chief complaint of seizure.  Patient was in the post- ictal state by the time we saw him, so most of the history was obtained from the ER physician and chart.  Patient remembers him having a seizure but states "I blacked out'' and could not provide any further accurate details. He states that he was having some nausea and vomiting for last 2 days . But could not tell us about the abdominal pain, bladder or bowel complaints associated with it. ER physician reports him having 3 witnessed episodes in total, 1 at home, 1 in he EMS and third in the ED itself witnessed by the nurse- described as generalized tonic clonic. Patient lost conscious with each of these episodes. After that, patient was also noticed having some discrete events when he would turn his face to the left, roll up his eyes which was accompanied by twitching of his left arm but during these episode he was conscious.    AnatomyMEDICATIONS  1. Aspirin 81 mg daily.   2. Clonidine 0.1 mg p.o. b.i.d.   3. Dilantin 100 mg p.o. t.i.d.   4. Namenda 5 mg p.o. b.i.d.   5. Multivitamin one p.o. daily.   6. Keppra 1500 mg p.o. b.i.d.   7. Aricept 10 mg p.o. at bedtime.   8. Creon 6000 units t.i.d. with meals.   9. Folic acid 800 mcg p.o. daily.   10.Magnesium oxide 400 mg daily.   11.Norvasc 5 mg p.o. daily.   12.Protonix 40 mg p.o. b.i.d.   13.Thiamine 250 mg p.o. daily     Allergies: NKDA   PAST MEDICAL HISTORY 1. Seizure disorder since 2001.       a.     Multiple breakthrough seizures in the setting of alcohol        abuse and medication noncompliance.       b.     Begin after post traumatic subarachnoid hemorrhage leading        to subdural hematoma.       c.     Likely ischemic infarcts contributing to onset as well.             d   EEG on 07/28/10- Focal area of epileptiform activity appreciated in the right        frontal lateral region   2. Longstanding history of ongoing alcohol abuse.   3. History of CVA in the past - MRI from 01/12- shows old infarcts  4. Diet-controlled diabetes mellitus.   5. 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   6. Hypertension.   7. Gastroesophageal reflux disease.   8. History of intracranial hemorrhage 2002 and 2010 as discussed  above.   9.Medication noncompliance.    FAMILY HISTORY:  Sister has colon cancer.      SOCIAL HISTORY:  He is married, lives with his wife.  He is a smoker and   heavy alcohol drinker.       Review of Systems: Pertinent items are noted in HPI.  Physical Exam:  Vitals: T-99.2, BP-110/89, R-16, HR-92, O2 SATS-100% on RA General appearance: alert, slowed mentation Head: Normocephalic, without obvious abnormality, atraumatic Neck: no adenopathy, no carotid bruit, no JVD, supple, symmetrical, trachea midline and thyroid not enlarged, symmetric, no tenderness/mass/nodules Lungs: Clear to auscultation bilaterally Heart: regular rate and rhythm, S1, S2 normal, no murmur, click, rub or gallop Abdomen: soft, diffusely tender to palpation, positive bowel sounds, no organomegaly. Extremities: extremities normal, atraumatic, no cyanosis or edema Pulses: 2+ and symmetric Lymph nodes: Cervical, supraclavicular, and axillary nodes normal. Neurologic: Alert x oriented x2, was following  commands?left side facial droop, motor strength 3+/5 on left upper and lower extermities, 5/5 in right upper and lower extremities, senastions decreased to light touch on the left side as compared to the right.  Lab results:  WBC                                      8.6               4.0-10.5         K/uL  RBC                                      4.29              4.22-5.81        MIL/uL  Hemoglobin (HGB)                         12.9       l      13.0-17.0        g/dL  Hematocrit (HCT)                         36.9       l      39.0-52.0        %  MCV                                      86.0              78.0-100.0       fL  MCH -                                    30.1              26.0-34.0        pg  MCHC                                     35.0              30.0-36.0  g/dL  RDW                                      13.8              11.5-15.5        %  Platelet Count (PLT)                     166               150-400          K/uL  Neutrophils, %                           78         h      43-77            %  Lymphocytes, %                           13                12-46            %  Monocytes, %                             8                 3-12             %  Eosinophils, %                           1                 0-5              %  Basophils, %                             0                 0-1              %  Neutrophils, Absolute                    6.7               1.7-7.7          K/uL  Lymphocytes, Absolute                    1.2               0.7-4.0          K/uL  Monocytes, Absolute                      0.7               0.1-1.0          K/uL  Eosinophils, Absolute                    0.1               0.0-0.7  K/uL  Basophils, Absolute                      0.0               0.0-0.1          K/uL    TCO2                                     20                0-100            mmol/L  Ionized Calcium                          1.13              1.12-1.32         mmol/L  Hemoglobin (HGB)                         13.6              13.0-17.0        g/dL  Hematocrit (HCT)                         40.0              39.0-52.0        %  Sodium (NA)                              137               135-145          mEq/L  Potassium (K)                            3.2        l      3.5-5.1          mEq/L  Chloride                                 100               96-112           mEq/L  Glucose                                  114        h      70-99            mg/dL  BUN                                      9                 6-23             mg/dL  Creatinine  0.70              0.50-1.35        Mg/dL   Alcohol                                  <11               0-11             Mg/dL   Phenytoin                                2.7        l      10.0-20.0        Ug/mL   Color, Urine                             AMBER      a      YELLOW    BIOCHEMICALS MAY BE AFFECTED BY COLOR  Appearance                               CLOUDY     a      CLEAR  Specific Gravity                         1.022             1.005-1.030  pH                                       5.5               5.0-8.0  Urine Glucose                            NEGATIVE          NEG              mg/dL  Bilirubin                                NEGATIVE          NEG  Ketones                                  15         a      NEG              mg/dL  Blood                                    NEGATIVE          NEG  Protein                                  100        a  NEG              mg/dL  Urobilinogen                             0.2               0.0-1.0          mg/dL  Nitrite                                  NEGATIVE          NEG  Leukocytes                               NEGATIVE          NEG  Squamous Epithelial / LPF                FEW        a      RARE  WBC / HPF                                7-10              <3               WBC/hpf  Bacteria / HPF                            FEW        a      RARE  Urine-Other                              SEE NOTE.    MUCOUS PRESENT  Amphetamins                              SEE NOTE.         NDT    NONE DETECTED  Barbiturates                             POSITIVE   a      NDT    Oversized comment, see footnote  1  Benzodiazepines                          POSITIVE   a      NDT  Cocaine                                  SEE NOTE.         NDT    NONE DETECTED  Opiates                                  SEE NOTE.         NDT    NONE DETECTED  Tetrahydrocannabinol  SEE NOTE.         NDT    NONE DETECTED  Imaging results:  CXR:  No active cardiopulmonary disease.  CT- head-1.  No acute finding.   2.  Atrophy and chronic microvascular ischemic change.   Assessment & Plan by Problem:  1. Seizure: Patient has history of seizure disorder, medication noncompliance and multiple admission in the past from seizures due to medication non-compliance or alcohol withdrawal. He had an episode of breakthrough seizure ( presentation likely consistent with complex partial seizure with secondary generalization) with sub therapeutic dilantin levels either from medication non-compliance or from the intercurrent illness ( not able to keep anything down from nausea and vomiting). 1.Admit to inpatient telemetry 2.Seizure and fall precautions 3. Will get a swallow evaluation 4. Will recheck dilantin and serum albumin levels. 5. He got a loading dose of dilantin and Keppra in the ER. After he passes swallow evaluation, will restart him on home does of dilantin and Keppra. 6. Neurology consult.  2. Left sided weakness: Differentials include weakness from seizure vs new stroke. He has history of CVA in the past as reviewed from previous imaging. We may consider getting an MRI tomorrow if the weakness persists.  3. Alcohol abuse: CIWA protocol. Will start him on thaimaine and folate.  4. Hypokalemia; likely from decreased  intake or alcohol abuse. Replete Will check serum Mg levels.  5. Hypertension: Will continue him on clonidine low dose for now. We may consider putting him back to home dose tomorrow. Continue norvasc.  6. Dementia; Alzheimer's vs alcohol related. Continue Nemanda and Donepezil  7. GERD: Continue protonix  8. Chronic pancereatitis, hx of : will restart him on creon after he passes swallow evaluation. Wil treat his pain symptomatically with PRN morphine.  8. DVT: SCD's.  R2/3___Megha Salvatore Poe   [phone]___________________________      R1___Elizabeth Dorise Hiss       [phone]_____________________________  ATTENDING: I performed and/or observed a history and physical examination of the patient.  I discussed the case with the residents as noted and reviewed the residents' notes.  I agree with the findings and plan--please refer to the attending physician note for more details.  Signature________________________________  Printed Name_____________________________

## 2010-11-20 DIAGNOSIS — R569 Unspecified convulsions: Secondary | ICD-10-CM

## 2010-11-20 LAB — COMPREHENSIVE METABOLIC PANEL
ALT: 16 U/L (ref 0–53)
Alkaline Phosphatase: 84 U/L (ref 39–117)
CO2: 28 mEq/L (ref 19–32)
Chloride: 103 mEq/L (ref 96–112)
GFR calc Af Amer: >60 mL/min (ref >60)
Glucose, Bld: 168 mg/dL — ABNORMAL HIGH (ref 70–99)
Potassium: 3.2 mEq/L — ABNORMAL LOW (ref 3.5–5.1)
Sodium: 137 mEq/L (ref 135–145)
Total Bilirubin: 0.2 mg/dL — ABNORMAL LOW (ref 0.3–1.2)
Total Protein: 5.4 g/dL — ABNORMAL LOW (ref 6.0–8.3)

## 2010-11-20 LAB — GLUCOSE, CAPILLARY

## 2010-11-20 LAB — DIFFERENTIAL
Basophils Absolute: 0 10*3/uL (ref 0.0–0.1)
Basophils Relative: 0 % (ref 0–1)
Lymphocytes Relative: 21 % (ref 12–46)
Neutro Abs: 3.3 10*3/uL (ref 1.7–7.7)
Neutrophils Relative %: 70 % (ref 43–77)

## 2010-11-20 LAB — CBC
Hemoglobin: 11.8 g/dL — ABNORMAL LOW (ref 13.0–17.0)
MCHC: 34.1 g/dL (ref 30.0–36.0)
RBC: 4.01 MIL/uL — ABNORMAL LOW (ref 4.22–5.81)
WBC: 4.6 10*3/uL (ref 4.0–10.5)

## 2010-11-20 NOTE — Consult Note (Signed)
NAME:  [name] [name], [name] [name]             ACCOUNT NO.:  [id number]  MEDICAL RECORD NO.:  [id number]  LOCATION:  [facility]                         FACILITY:  [facility]  PHYSICIAN:  Kipp Laurence, MD DATE OF BIRTH:  1950/04/20  DATE OF CONSULTATION:  11/19/2010 DATE OF DISCHARGE:                                CONSULTATION   CHIEF COMPLAINT:  Seizure.  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  The patient presents to the ER tonight after suffering a breakthrough seizure at home.  This was witnessed by his wife.  He called EMS who brought him into the hospital.  While in the emergency department, the patient was witnessed to have two other seizures and was given IV Ativan to stop these.  He was then loaded was 1 gram of IV Dilantin and 1 gram of IV Keppra.  He has not had further seizure activity since his medication loads.  At the time of my evaluation, the patient was still very somnolent from his postictal state and from the medications he received, though he was able to provide most of his history.  The patient reports that he has an average of one to two seizures per month.  He describes these as beginning with a sensation of nausea and dizziness followed by rapid loss of consciousness.  He was able to provide me no other details of his seizure activity.  The patient has been evaluated multiple times before at this hospital with multiple previous admissions for seizure activity in the setting of alcohol withdrawal and in the setting of medical noncompliance.  He reports today that he has been compliant with all of his seizure medications and states that his wife make sure that he takes all of these.   He does report problems with nausea and vomiting the night before last and states that he was unable to keep his seizure medications down because of emesis.  The patient does have an extensive history of alcohol abuse; however, he says he only currently drinks one beer per night and his alcohol level was undetectable here today.  Dilantin level checked in the emergency department prior to his IV load was 2.7.  A CT of head was performed and the patient has multiple chronic changes as detailed below, but no acute changes were noted.  The patient was able to provide me with no further history other than to state that he has a neurologist in [facility] whose he was unable to remember.  I attempted to phone his wife to get further history; however, there was no response with the number provided.  PAST MEDICAL HISTORY: 1. Hypertension. 2. Diabetes. 3. History of right thalamic CVA. 4. History of alcohol abuse. 5. History of alcoholic liver disease. 6. History of chronic pancreatitis. 7. 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 8. 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  Last EEG details a right frontal focus of onset.  9. History of head trauma with subarachnoid and subdural hemorrhages     in 2002. 10.GERD. 11.History of medical noncompliance.  MEDICATIONS: 1. Lisinopril 20 mg daily. 2. Amlodipine 5 mg daily. 3. Dilantin extended release 100 mg three tablets at bedtime. 4. Creon one capsule three times daily. 5. Namenda 5 mg daily. 6. Levetiracetam 500 mg three tablets twice daily. 7. Donepezil ODT 10 mg sublingual nightly.  ALLERGIES:  The patient per record has no known drug allergies.  FAMILY HISTORY:  Per record, the patient's sister has history of cancer. The patient was unable to provide further family history during my evaluation secondary to somnolence.  SOCIAL HISTORY:  The patient has a history of  smoking per record; however, he was unable to provide me details today secondary to somnolence.  He reports drinking on average one beer per day, but per record has a history of heavy use and withdrawal symptoms.  He denies any illicit substance use.   REVIEW OF SYSTEMS:  Unable to obtain a review of systems secondary to the patient's somnolence.  PHYSICAL EXAMINATION:   VITAL SIGNS:  Blood pressure 110/89, pulse 92, respiratory rate 16, temperature is 99.2. CARDIOVASCULAR:  Regular rate and rhythm.  No murmurs, gallops, or rubs. No carotid bruits auscultated. PULMONARY:  Clear to auscultation bilaterally.  NEUROLOGIC:   Mental Status:  The patient is somnolent and required repeated stimulation to attend throughout the exam.  He is able to follow some one-step commands, but was unable to stay attentive enough to follow multi-step commands.  Speech is mildly slurred.  Language is fluent.  Memory appears poor to recent events.   Cranial Nerves:  II- pupils are equally round, and reactive to light.  Funduscopic exam is intact.  Intact blink to threat in all visual fields. III, IV, VI- extraocular movements intact.  No nystagmus.  V- thepatient reports mild decrease in sensation over the left side of his face.  VII- left lower facial droop.  VIII- auditory acuity is intact. IX, X- uvula is benign.  Palate elevates symmetrically.  XI- 5/5 strength in bilateral sternocleidomastoids and trapezius.  XII- tongue is midline and does not deviate.   Motor:  The patient withdrawals to painful stimuli in all extremities; however, he does so with approximately 4/5 strength in the left lower extremity and 4-/5 strength in the left upper extremity compared to 5/5 throughout on the right. Sensory:  The patient responds to touch throughout.   Coordination:  The patient was unable to follow with exam secondary to somnolence. Reflexes:  The patient has 2+ reflexes throughout on the right, 3+ at the  biceps, triceps and patellar on the left with 2+ at the brachioradialis and ankle jerks.  The patient does have a Babinski on the left; absent on the right.   Gait:  Unable to assess secondary to the patient's mental status and fall risk.  LABORATORY STUDIES:  BMP is significant for potassium of 3.2.  CBC is unremarkable.  Alcohol level is undetectable.  Dilantin level 2.7.  UDS positive for benzos and barbiturates.  UA with 7-10 white blood cells and rare bacteria noted.  IMAGING: 1. CT of the head demonstrates multifocal white matter changes likely     secondary to chronic microvascular disease, previous strokes and     previous traumatic history, old right thalamic infarct is also     noted.  The patient has significant atrophy throughout.  No acute     changes were seen. 2. EEG report  was reviewed from Jul 28, 2010.  On the study, the     patient was noted to have the presence of right hemispheric     electrographic activity most prominent in the right frontolateral     convexity recorded at [facility].  The patient was also found to have     generalized slowing, which was seen on previous studies.  ASSESSMENT:  A 60 year old African American gentleman with history of a complex partial seizure disorder with secondary generalization, previous traumatic brain injury with subarachnoid and subdural hemorrhages and alcohol abuse with previous withdrawal seizures, presenting with breakthrough seizures in the setting of subtherapeutic Dilantin level due to either his recent GI illness or medical noncompliance.  PLAN: 1. Recommend checking a post-load Dilantin level and targeting a     correct trough of 15-20.  Would also check serum albumin to help     with correcting his level given his liver disease.  Please see     notation in my written note regarding formula for Dilantin level     correction. 2. Left-sided weakness noted on the patient's exam has been noted in     him postictally on  previous exams but is apparently not present at     his baseline. If this weakness is not resolved by tomorrow     morning, would recommend assessing an MRI of the brain to     evaluate for another etiology. This is likely, however, a postictal     phenomenon. 3. Agree with restarting his home doses of Keppra and Dilantin. 4. If there is concern for the patient's ability to swallow, consider     converting to IV doses for this evening. Recommend giving 500 more     IV of Keppra and, if his Dilantin level is low, another 500 mg IV     Dilantin.  Once level was therapeutic until swallowing returns to     baseline, he should receive 1500 mg twice daily IV of Keppra and 100     mg three times daily IV Dilantin. P.O. dosing is preferred as soon     as he can be converted. 5. Recommend seizure precautions.  The patient reports he is not     driving and should be reminded upon discharge to avoid any activity     during which it would be dangerous for him to have a loss of     consciousness. 6. Recommend close followup after discharge with his primary     neurologist in [place].  Thank you very much for this consultation.          ______________________________ Kipp Laurence, MD     ES/MEDQ  D:  11/19/2010  T:  11/20/2010  Job:  [id number]  Electronically Signed by Kipp Laurence MD on 11/20/2010 05:47:39 AM

## 2010-11-21 DIAGNOSIS — R569 Unspecified convulsions: Secondary | ICD-10-CM

## 2010-11-21 LAB — BASIC METABOLIC PANEL
GFR calc Af Amer: >60 mL/min (ref >60)
GFR calc non Af Amer: >60 mL/min (ref >60)
Glucose, Bld: 144 mg/dL — ABNORMAL HIGH (ref 70–99)
Potassium: 4.1 mEq/L (ref 3.5–5.1)
Sodium: 137 mEq/L (ref 135–145)

## 2010-11-21 LAB — HEMOGLOBIN A1C: Mean Plasma Glucose: 134 mg/dL — ABNORMAL HIGH (ref <117)

## 2010-11-21 LAB — RPR: RPR Ser Ql: NONREACTIVE

## 2010-11-25 ENCOUNTER — Inpatient Hospital Stay (HOSPITAL_COMMUNITY)
Admission: EM | Admit: 2010-11-25 | Discharge: 2010-12-01 | DRG: 439 | Disposition: A | Discharge status: Skilled Nursing Facility | Payer: Medicare Other | Attending: Internal Medicine | Admitting: Internal Medicine

## 2010-11-25 ENCOUNTER — Emergency Department (HOSPITAL_COMMUNITY): Payer: Medicare Other

## 2010-11-25 DIAGNOSIS — D72829 Elevated white blood cell count, unspecified: Secondary | ICD-10-CM | POA: Diagnosis present

## 2010-11-25 DIAGNOSIS — K861 Other chronic pancreatitis: Secondary | ICD-10-CM | POA: Diagnosis present

## 2010-11-25 DIAGNOSIS — G40909 Epilepsy, unspecified, not intractable, without status epilepticus: Secondary | ICD-10-CM | POA: Diagnosis present

## 2010-11-25 DIAGNOSIS — N390 Urinary tract infection, site not specified: Secondary | ICD-10-CM | POA: Diagnosis present

## 2010-11-25 DIAGNOSIS — K219 Gastro-esophageal reflux disease without esophagitis: Secondary | ICD-10-CM | POA: Diagnosis present

## 2010-11-25 DIAGNOSIS — I10 Essential (primary) hypertension: Secondary | ICD-10-CM | POA: Diagnosis present

## 2010-11-25 DIAGNOSIS — E119 Type 2 diabetes mellitus without complications: Secondary | ICD-10-CM | POA: Diagnosis present

## 2010-11-25 DIAGNOSIS — F068 Other specified mental disorders due to known physiological condition: Secondary | ICD-10-CM | POA: Diagnosis present

## 2010-11-25 DIAGNOSIS — E785 Hyperlipidemia, unspecified: Secondary | ICD-10-CM | POA: Diagnosis present

## 2010-11-25 DIAGNOSIS — K859 Acute pancreatitis without necrosis or infection, unspecified: Principal | ICD-10-CM | POA: Diagnosis present

## 2010-11-25 DIAGNOSIS — I4891 Unspecified atrial fibrillation: Secondary | ICD-10-CM | POA: Diagnosis present

## 2010-11-25 DIAGNOSIS — I498 Other specified cardiac arrhythmias: Secondary | ICD-10-CM | POA: Diagnosis present

## 2010-11-25 DIAGNOSIS — N179 Acute kidney failure, unspecified: Secondary | ICD-10-CM | POA: Diagnosis present

## 2010-11-25 LAB — POCT I-STAT 3, VENOUS BLOOD GAS (G3P V)
Bicarbonate: 28.8 mEq/L — ABNORMAL HIGH (ref 20.0–24.0)
O2 Saturation: 98 %
pCO2, Ven: 25.2 mmHg — ABNORMAL LOW (ref 45.0–50.0)
pO2, Ven: 80 mmHg — ABNORMAL HIGH (ref 30.0–45.0)

## 2010-11-25 LAB — COMPREHENSIVE METABOLIC PANEL
Albumin: 5.1 g/dL (ref 3.5–5.2)
BUN: 26 mg/dL — ABNORMAL HIGH (ref 6–23)
Creatinine, Ser: 2.98 mg/dL — ABNORMAL HIGH (ref 0.50–1.35)
Potassium: 3.5 mEq/L (ref 3.5–5.1)
Total Protein: 8.9 g/dL — ABNORMAL HIGH (ref 6.0–8.3)

## 2010-11-25 LAB — LIPASE, BLOOD: Lipase: 88 U/L — ABNORMAL HIGH (ref 11–59)

## 2010-11-25 LAB — CBC
HCT: 50.1 % (ref 39.0–52.0)
RBC: 5.96 MIL/uL — ABNORMAL HIGH (ref 4.22–5.81)
RDW: 13.7 % (ref 11.5–15.5)
WBC: 28.8 10*3/uL — ABNORMAL HIGH (ref 4.0–10.5)

## 2010-11-25 LAB — PHENYTOIN LEVEL, TOTAL: Phenytoin Lvl: <2.5 ug/mL — ABNORMAL LOW (ref 10.0–20.0)

## 2010-11-25 NOTE — Discharge Summary (Signed)
NAME:  [name] [name], [name] [name]             ACCOUNT NO.:  [id number]  MEDICAL RECORD NO.:  [id number]  LOCATION:  [facility]                         FACILITY:  [facility]  PHYSICIAN:  C. Ulyess Mort, M.D.DATE OF BIRTH:  [birth year]  DATE OF ADMISSION:  11/19/2010 DATE OF DISCHARGE:  11/23/2010                              DISCHARGE SUMMARY   DISCHARGE DIAGNOSES: 1. Seizure disorder since 2001 with history of multiple breakthrough     seizure secondary to medication noncompliance and alcohol     withdrawal, inciting incident for seizures with  traumatic     subarachnoid hemorrhage, and subsequent subdural hematoma in the     past.  EEG revealed epileptiform activity appreciated in the right     frontal lateral region. 2. 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 3. History of alcohol abuse. 4. Diet-controlled diabetes mellitus. 5. 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 6. Gastroesophageal reflux disease. 7. Hypertension. 8. History of chronic pancreatitis.  DISCHARGE MEDICATIONS: 1. Aspirin 81 mg by mouth daily. 2. Amlodipine 5 mg 1 tablet by mouth daily. 3. Creon 6000 units 1 capsule by mouth 3 times a day. 4. Dilantin 100 mg tablets, 3 tablets by mouth daily at bedtime. 5. Donepezil 10 mg 1 tablet under tongue daily at bedtime. 6. Levetiracetam 500 mg tablets, 3 tablets by mouth twice daily. 7. Lisinopril 20 mg 1 tablet by mouth daily. 8. Namenda 5 mg 1 tablet by mouth daily.  DISPOSITION AND FOLLOWUP:  The patient is medically stable for safe discharge to skilled nursing facility.  He is being discharged to skilled nursing facility as his wife did not feel like she can manage him on her own currently.    He will follow up with his neurologist at [facility]  (Dr. Floreen Comber, [phone] check Dilantin levels and ensure that  medication dosages are appropriate.  I spoke with his wife [name] [name] on the morning of November 23, 2010  and she reported that she is making followup appointment today.  Ms. Joos phone number is [phone].  Of note, the patient was hospitalized for 5 days during which he was on CIWA protocol and did not have evidence of significant withdrawal.  Patient's PCP is Dr. Myriam Jacobson at [facility].  PROCEDURES DURING HOSPITALIZATION:   1. A head CT without contrast on November 19, 2010 revealed no acute findings, atrophy and chronic microvascular ischemic changes.    2. Chest x-ray on November 19, 2010, no active cardiopulmonary disease.  CONSULTATIONS DURING HOSPITALIZATION:  Neurology.  CHIEF COMPLAINT:  Seizures.  HISTORY OF PRESENT ILLNESS:  The patient is a 60 year old man with past medical history significant for seizure disorder since 2001 secondary to medication noncompliance, multiple admissions for breakthrough seizures presented to the ED with chief complaint of seizure.  The patient was in a postictal state by the time he was seen, so most of the history was obtained from the ED physician and chart.  The patient remembers him having a seizure but says they blacked out and could not provide any further accurate details.  He states that he was having some nausea and vomiting for 2 days but could not tell us about  the abdominal pain, bladder or bowel complaints associated with it.  ER physician reports having 3 witnessed episodes in total 1 at home, 1 in EMS, and 3rd in the ED itself witnessed by the nurse, described as generalized tonic-clonic. The patient lost consciousness with each of these episodes. After that the patient was also noticed having some discrete events when he would turn his face to the left, roll up his eyes which was accompanied by twitching of his left arm but during these episodes he was conscious.  PHYSICAL EXAMINATION:    VITALS AT ADMISSION:  T: 99.2, BP: 110/89, RR: 16, HR: 62, O2 saturation 100% on room air.  GENERAL APPEARANCE:   Alert, slowed mentation. HEAD:  Normocephalic without obvious abnormality, atraumatic. NECK:  No adenopathy.  No carotid bruit.  No JVD, supple, symmetrical. Trachea midline.  Thyroid not enlarged, symmetric.  No tenderness, mass, or nodules. LUNGS:  Clear to auscultation bilaterally. HEART:  Regular rate and rhythm.  Normal S1, S2.  No murmurs, rubs, clicks, or gallops. ABDOMEN:  Soft, diffusely tender to palpation.  Positive bowel sounds. No organomegaly. EXTREMITIES:  Normal, atraumatic.  No cyanosis or edema.  Pulses 2+ and symmetrical. LYMPH NODES:  Cervical, supraclavicular, and axillary nodes normal. NEUROLOGIC:  Alert and oriented x2, was following commands, questionable left-sided facial droop.  Motor strength 3+/5 on upper and lower extremities, 5/5 in right upper and lower extremities.  Sensation is decreased to light touch on the left side as compared to the right.  LABS AT ADMISSION:  White blood count 8.6, hemoglobin 12.9, hematocrit 36.9, MCV 86, platelet count 166,000.  I-STAT labs are as follows: Total CO2 20, ionized calcium 1.13, hemoglobin 13.6, hematocrit 40, sodium 137, potassium 3.2, chloride 100, glucose 114, BUN 9, creatinine 0.7.  Serum alcohol level was less than 11.  Dilantin 2.7.  Urinalysis was significant for only protein and ketones.  Urine drug screen was positive for barbiturates and benzos.  Serum albumin 3.4.  HOSPITAL COURSE:    1. The patient presented to [facility] ED status post seizure episode in a postictal state as described in H and P.  In the past the patient has recurrent seizure secondary to alcohol withdrawal and medication noncompliance.  He was admitted to telemetry and placed on seizure and fall precautions and he received a loading dose of Dilantin and Keppra.  After passing a swallow evaluation, he was restarted on his home doses of Keppra & Dilantin.  Because of his history of alcohol abuse, the patient was placed on CIWA protocol but  did not develop significant signs or symptoms of withdrawal during hospitalization.  On day 2 of hospitalization, he seemed to have an unsteady gait that was worse than his baseline as per his wife.  Physical therapy evaluation on day #3 suggested that he is back to baseline.  We also checked serum B12, hemoglobin A1c, and RPR levels to evaluate for peripheral neuropathy that may be attributing to unsteady gait but serum levels were not significant.  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  We did not pursue MRI.  2. Hypokalemia.  The patient presented with mild hypokalemia.  Serum     magnesium levels were within normal limits and potassium was     repleted.  Deficiency was thought to be secondary to decreased     intake. 3. Hypertension.  The patient's blood pressure was stable with no     acute issues during hospitalization.  We continued him on his home     antihypertensive regimen which we discharged him with. 4. Dementia thought to be secondary to Alzheimer's versus alcohol     induced.  The patient was at baseline as per wife.  At time of     discharge, he was awake, alert and reasonably oriented.  Given his     dementia, his wife indicated that she could not continue as his     full time caregiver at the current time and therefore the patient     will be discharged to skilled nursing facility so to ensure     medication compliance and encourage abstinence from alcohol.  DISCHARGE VITALS:  Temperature 98.5, pulse 57, respirations 18, blood pressure 135/82, saturating 99% on room air.  DISCHARGE LABS:  Vitamin B12 389.  RPR nonreactive.  Hemoglobin A1c 6.3. Sodium 137, potassium 4.1, chloride 104, CO2 of 30, glucose 144, BUN 6, creatinine 0.68, calcium 9.3, magnesium 2, Dilantin level was 18.9 when corrected with an albumin of 3.4.  Corrected Dilantin levels is  25.    ______________________________ Vernice Jefferson, MD   ______________________________ C. Ulyess Mort, M.D.    NK/MEDQ  D:  11/23/2010  T:  11/23/2010  Job:  [id number]  Electronically Signed by Vernice Jefferson MD on 11/23/2010 04:32:57 PM Electronically Signed by Eliezer Lofts M.D. on 11/25/2010 05:12:30 PM

## 2010-11-26 ENCOUNTER — Encounter: Payer: Self-pay | Admitting: Ophthalmology

## 2010-11-26 DIAGNOSIS — K859 Acute pancreatitis without necrosis or infection, unspecified: Secondary | ICD-10-CM

## 2010-11-26 DIAGNOSIS — R112 Nausea with vomiting, unspecified: Secondary | ICD-10-CM

## 2010-11-26 DIAGNOSIS — F1010 Alcohol abuse, uncomplicated: Secondary | ICD-10-CM

## 2010-11-26 DIAGNOSIS — N289 Disorder of kidney and ureter, unspecified: Secondary | ICD-10-CM

## 2010-11-26 LAB — BASIC METABOLIC PANEL
BUN: 73 mg/dL — ABNORMAL HIGH (ref 6–23)
GFR calc Af Amer: 30 mL/min — ABNORMAL LOW (ref >60)
GFR calc non Af Amer: 25 mL/min — ABNORMAL LOW (ref >60)
Potassium: 4.3 mEq/L (ref 3.5–5.1)

## 2010-11-26 LAB — ETHANOL: Alcohol, Ethyl (B): <11 mg/dL (ref 0–11)

## 2010-11-26 LAB — COMPREHENSIVE METABOLIC PANEL
BUN: 45 mg/dL — ABNORMAL HIGH (ref 6–23)
CO2: 29 mEq/L (ref 19–32)
Chloride: 91 mEq/L — ABNORMAL LOW (ref 96–112)
Creatinine, Ser: 3.22 mg/dL — ABNORMAL HIGH (ref 0.50–1.35)
GFR calc Af Amer: 24 mL/min — ABNORMAL LOW (ref >60)
GFR calc non Af Amer: 20 mL/min — ABNORMAL LOW (ref >60)
Total Bilirubin: 0.1 mg/dL — ABNORMAL LOW (ref 0.3–1.2)

## 2010-11-26 LAB — CBC
HCT: 45.9 % (ref 39.0–52.0)
MCV: 84.8 fL (ref 78.0–100.0)
RBC: 5.41 MIL/uL (ref 4.22–5.81)
WBC: 22.4 10*3/uL — ABNORMAL HIGH (ref 4.0–10.5)

## 2010-11-26 LAB — DIFFERENTIAL
Band Neutrophils: 8 % (ref 0–10)
Blasts: 0 %
Metamyelocytes Relative: 0 %
Monocytes Absolute: 1.2 10*3/uL — ABNORMAL HIGH (ref 0.1–1.0)
Monocytes Relative: 4 % (ref 3–12)
Myelocytes: 0 %
Promyelocytes Absolute: 0 %
nRBC: 0 /100 WBC

## 2010-11-26 LAB — URINALYSIS, ROUTINE W REFLEX MICROSCOPIC
Glucose, UA: >1000 mg/dL — AB
Nitrite: NEGATIVE
Specific Gravity, Urine: 1.027 (ref 1.005–1.030)
pH: 5 (ref 5.0–8.0)

## 2010-11-26 LAB — CK TOTAL AND CKMB (NOT AT ARMC)
CK, MB: 2.3 ng/mL (ref 0.3–4.0)
Total CK: 49 U/L (ref 7–232)

## 2010-11-26 LAB — TROPONIN I: Troponin I: <0.3 ng/mL (ref <0.30)

## 2010-11-26 LAB — URINE MICROSCOPIC-ADD ON

## 2010-11-26 LAB — CARDIAC PANEL(CRET KIN+CKTOT+MB+TROPI)
CK, MB: 3.7 ng/mL (ref 0.3–4.0)
Total CK: 97 U/L (ref 7–232)

## 2010-11-26 LAB — PHOSPHORUS: Phosphorus: 5.5 mg/dL — ABNORMAL HIGH (ref 2.3–4.6)

## 2010-11-26 NOTE — H&P (Signed)
Hospital Admission Note Date: 11/26/2010  Patient name: [name] [name] Medical record number: [id number] Date of birth: Oct 02, 1950 Age: 60 y.o. Gender: male PCP: No primary provider on file.  Medical Service: Internal Medicine Teaching Service  Attending physician:  Dr. Josem Kaufmann     Resident (R2/R3): Dr. Loistine Chance     Pager: [phone] Resident (R1): Dr. Clyde Lundborg     Pager: [phone]  Chief Complaint: abdominal pain, vomiting  History of Present Illness: patient is a 59 year old man recently discharged from the teaching service with a history of stroke, seizures and alcohol abuse who presents with abdominal pain and vomiting. He was discharged to a skilled nursing facility on 8/29 after being treated for seizure. On chart review, patient did present with symptoms of abdominal pain and vomiting for two days on to admission on 8/25 but this was not worked up. Patient and family member were unable to provide further information about his symptoms. Patient's daughter accompanied him in the ED, but she said that his niece, [name] [name] who works at the SNF is the only family member who knows the details of his recent symptoms. Requested that we wait to contact niece until AM since she is already asleep. Patient's wife will also be in hospital tomorrow.    Current Outpatient Prescriptions on File Prior to Visit  Medication Sig Dispense Refill  . amLODipine (NORVASC) 5 MG tablet Take 5 mg by mouth daily.        Marland Kitchen aspirin 81 MG tablet Take 81 mg by mouth daily.        Marland Kitchen donepezil (ARICEPT) 10 MG tablet Take 10 mg by mouth at bedtime.        . folic acid (FOLVITE) 800 MCG tablet Take 400 mcg by mouth daily.        Marland Kitchen levETIRAcetam (KEPPRA) 1000 MG tablet 1,000 mg. Take 1.5 tab by mouth twice daily.       . magnesium oxide (MAG-OX) 400 MG tablet Take 400 mg by mouth daily.        . memantine (NAMENDA) 5 MG tablet Take 5 mg by mouth 2 (two) times daily.        . Pancrelipase, Lip-Prot-Amyl, (CREON) 6000 UNITS  CPEP Take 1 each by mouth 3 (three) times daily.        . phenytoin (DILANTIN) 100 MG ER capsule Take by mouth 3 (three) times daily.        Marland Kitchen thiamine 250 MG tablet Take 250 mg by mouth daily.         Allergies: Review of patient's allergies indicates no known allergies.  Past Medical History  Diagnosis Date  . Seizure disorder 2001     EEG 07/27/10, alcohol abuse & med noncomplliance, began after traumatic subarachnoid hem  . Alcohol abuse   . CVA (cerebral infarction)   . 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 HTN (hypertension)   . GERD (gastroesophageal reflux disease)    No past surgical history on file.  Family History  Problem Relation Age of Onset  . Colon cancer Sister    History   Social History  . Marital Status: Married    Spouse Name: N/A    Number of Children: N/A  . Years of Education: N/A   Occupational History  . Not on file.   Social History Main Topics  . Smoking status: yes  . Smokeless tobacco: Not on file  . Alcohol Use: yes  . Drug Use:  Not on file  . Sexually Active: Not on file   Social History Narrative   He is married, lives with his wife. He is a smoker and heavy alcohol drinker.    Review of Systems: As per HPI.  Physical Exam: Vitals: T: 97.6  HR: 104  BP: 141/97  RR: 22  O2 saturation: 98% RA General: cachectic elderly appearing male asleep in bed HEENT: PERRL, EOMI, no scleral icterus Cardiac: RRR, no rubs, murmurs or gallops Pulm: clear to auscultation bilaterally, moving normal volumes of air Abd: soft, nontender, nondistended, BS present Ext: warm and well perfused, no pedal edema Neuro: alert and oriented X1- unable to provide his name or the date, cranial nerves II-XII grossly intact. Patient was somnolent but arousable. Did not test sensation/strength due to difficulty with commands.  Lab results: Admission on 11/25/2010  Component Date Value Range Status  . Sodium (mEq/L) 11/25/2010 133* 135-145 Final  .  Potassium (mEq/L) 11/25/2010 3.5  3.5-5.1 Final  . Chloride (mEq/L) 11/25/2010 85* 96-112 Final  . CO2 (mEq/L) 11/25/2010 27  19-32 Final  . Glucose, Bld (mg/dL) 16/12/9602 540* 98-11 Final  . BUN (mg/dL) 91/47/8295 26* 6-21 Final  . Creatinine, Ser (mg/dL) 30/86/5784 6.96* 2.95-2.84 Final  . Calcium (mg/dL) 13/24/4010 27.2  5.3-66.4 Final  . Total Protein (g/dL) 40/34/7425 8.9* 9.5-6.3 Final  . Albumin (g/dL) 87/56/4332 5.1  9.5-1.8 Final  . AST (U/L) 11/25/2010 20  0-37 Final  . ALT (U/L) 11/25/2010 33  0-53 Final  . Alkaline Phosphatase (U/L) 11/25/2010 160* 39-117 Final  . Total Bilirubin (mg/dL) 84/16/6063 0.2* 0.1-6.0 Final  . GFR calc non Af Amer (mL/min) 11/25/2010 22* >60 Final  . GFR calc Af Amer (mL/min) 11/25/2010 26* >60 Final  . Phenytoin Lvl (ug/mL) 11/25/2010 <2.5* 10.0-20.0 Final  . Lipase (U/L) 11/25/2010 88* 11-59 Final  . Neutrophils Relative (%) 11/25/2010 86* 43-77 Final  . Lymphocytes Relative (%) 11/25/2010 2* 12-46 Final  . Monocytes Relative (%) 11/25/2010 4  3-12 Final  . Eosinophils Relative (%) 11/25/2010 0  0-5 Final  . Basophils Relative (%) 11/25/2010 0  0-1 Final  . Band Neutrophils (%) 11/25/2010 8  0-10 Final  . Metamyelocytes Relative (%) 11/25/2010 0   Final  . Myelocytes (%) 11/25/2010 0   Final  . Promyelocytes Absolute (%) 11/25/2010 0   Final  . Blasts (%) 11/25/2010 0   Final  . nRBC (/100 WBC) 11/25/2010 0  0 Final  . Neutro Abs (K/uL) 11/25/2010 27.0* 1.7-7.7 Final  . Lymphs Abs (K/uL) 11/25/2010 0.6* 0.7-4.0 Final  . Monocytes Absolute (K/uL) 11/25/2010 1.2* 0.1-1.0 Final  . Eosinophils Absolute (K/uL) 11/25/2010 0.0  0.0-0.7 Final  . Basophils Absolute (K/uL) 11/25/2010 0.0  0.0-0.1 Final  . WBC (K/uL) 11/25/2010 28.8* 4.0-10.5 Final  . RBC (MIL/uL) 11/25/2010 5.96* 4.22-5.81 Final  . Hemoglobin (g/dL) 10/93/2355 73.2* 20.2-54.2 Final  . HCT (%) 11/25/2010 50.1  39.0-52.0 Final  . MCV (fL) 11/25/2010 84.1  78.0-100.0 Final  . MCH  (pg) 11/25/2010 30.4  26.0-34.0 Final  . MCHC (g/dL) 70/62/3762 83.1* 51.7-61.6 Final  . RDW (%) 11/25/2010 13.7  11.5-15.5 Final  . Platelets (K/uL) 11/25/2010 373  150-400 Final  . pH, Ven  11/25/2010 7.665* 7.250-7.300 Final  . pCO2, Ven (mmHg) 11/25/2010 25.2* 45.0-50.0 Final  . pO2, Ven (mmHg) 11/25/2010 80.0* 30.0-45.0 Final  . Bicarbonate (mEq/L) 11/25/2010 28.8* 20.0-24.0 Final  . TCO2 (mmol/L) 11/25/2010 30  0-100 Final  . O2 Saturation (%) 11/25/2010 98.0   Final  .  Acid-Base Excess (mmol/L) 11/25/2010 10.0* 0.0-2.0 Final  . Patient temperature  11/25/2010 37.0 C   Final  . Sample type  11/25/2010 VENOUS   Final  . Comment  11/25/2010 NOTIFIED PHYSICIAN   Final  . Alcohol, Ethyl (B) (mg/dL) 16/12/9602 <54  0-98 Final  . Magnesium (mg/dL) 11/91/4782 2.4  9.5-6.2 Final  . Phosphorus (mg/dL) 13/10/6576 5.5* 4.6-9.6 Final  . Total CK (U/L) 11/26/2010 49  7-232 Final  . CK, MB (ng/mL) 11/26/2010 2.3  0.3-4.0 Final  . Relative Index  11/26/2010 RELATIVE INDEX IS INVALID  0.0-2.5 Final  . Troponin I (ng/mL) 11/26/2010 <0.30  <0.30 Final   Imaging results:    US IMPRESSION:   Gallbladder appears normal.  There is a suggestion of mild bile   duct dilatation of nonspecific etiology.  The pancreas and distal   bile ducts are obscured by overlying bowel gas.  Bilateral renal   cysts.  Other results: EKG: Atrial fibrillation with rate 109  Assessment & Plan by Problem: ASSESSMENT: 1. Abdominal pain due to -possible pancreatitis vs *Gastritis *Cholecystitis vs Cholelithiases is unlikely given liver and gallbladder US results. *UTI PLAN: - NPO apart from meds - IVF, D5 1/2 NS at 125 cc/hr x 2 L - EKG in AM - Urine C+S - Morphine 2 mg IV q 2-4 hr PRN pain - We decided against CT abdomen for now - Repeat CBC, CMP in AM  2. Elevated WBC count in a setting of normal body temperature. Possibly due to problem #1 vs * infection PLAN: -Blood Cx x2 -UA, micro -Will repeat CBC in  am and await for blood and urine cultures.  3. Acute kidney injury, likely pre-renal given history of emesis and possible dehydration. PLAN:  - Urine creatinine, sodium and B-met in order to calculate FeNA - will replete with IVF per above - Daily I/O's  4. A. Fib per EKG review with a HR in 110's. No history of A. Fib per chart review. Unclear etiology and needs a work up if he does not convert to a sinus rhythm within 48-72 hours.  Most likely explanation is that This is brought up by an acute illness. PLAN: -CEx3 6 hrs apart -Mg level; TSH - telemetry -consider anticoagulation in meantime (CHADs score is 3).  5. Seizure disorder. Will continue with medications but changed his seizure medications to IV form due to somnolence.  6.DVT prophylaxis Heparin 5000 Units SQ tid

## 2010-11-27 LAB — CBC
HCT: 33 % — ABNORMAL LOW (ref 39.0–52.0)
MCH: 29.6 pg (ref 26.0–34.0)
MCHC: 33.3 g/dL (ref 30.0–36.0)
MCV: 86.3 fL (ref 78.0–100.0)
MCV: 87.5 fL (ref 78.0–100.0)
Platelets: 218 10*3/uL (ref 150–400)
RDW: 14.3 % (ref 11.5–15.5)
RDW: 14.4 % (ref 11.5–15.5)
WBC: 11.5 10*3/uL — ABNORMAL HIGH (ref 4.0–10.5)

## 2010-11-27 LAB — BASIC METABOLIC PANEL
Calcium: 9 mg/dL (ref 8.4–10.5)
Chloride: 109 mEq/L (ref 96–112)
Creatinine, Ser: 1.39 mg/dL — ABNORMAL HIGH (ref 0.50–1.35)
GFR calc Af Amer: >60 mL/min (ref >60)
GFR calc non Af Amer: 52 mL/min — ABNORMAL LOW (ref >60)

## 2010-11-28 ENCOUNTER — Inpatient Hospital Stay (HOSPITAL_COMMUNITY): Payer: Medicare Other

## 2010-11-28 LAB — GLUCOSE, CAPILLARY
Glucose-Capillary: 136 mg/dL — ABNORMAL HIGH (ref 70–99)
Glucose-Capillary: 136 mg/dL — ABNORMAL HIGH (ref 70–99)
Glucose-Capillary: 133 mg/dL — ABNORMAL HIGH (ref 70–99)

## 2010-11-28 LAB — CBC
Platelets: 134 10*3/uL — ABNORMAL LOW (ref 150–400)
RBC: 3.13 MIL/uL — ABNORMAL LOW (ref 4.22–5.81)
WBC: 6.2 10*3/uL (ref 4.0–10.5)

## 2010-11-29 LAB — GLUCOSE, CAPILLARY
Glucose-Capillary: 126 mg/dL — ABNORMAL HIGH (ref 70–99)
Glucose-Capillary: 121 mg/dL — ABNORMAL HIGH (ref 70–99)
Glucose-Capillary: 139 mg/dL — ABNORMAL HIGH (ref 70–99)
Glucose-Capillary: 117 mg/dL — ABNORMAL HIGH (ref 70–99)
Glucose-Capillary: 98 mg/dL (ref 70–99)
Glucose-Capillary: 141 mg/dL — ABNORMAL HIGH (ref 70–99)

## 2010-11-29 LAB — CBC
HCT: 27 % — ABNORMAL LOW (ref 39.0–52.0)
MCHC: 34.4 g/dL (ref 30.0–36.0)
RDW: 13.3 % (ref 11.5–15.5)

## 2010-11-29 LAB — URINE CULTURE: Colony Count: NO GROWTH

## 2010-11-30 DIAGNOSIS — K859 Acute pancreatitis without necrosis or infection, unspecified: Secondary | ICD-10-CM

## 2010-11-30 DIAGNOSIS — N289 Disorder of kidney and ureter, unspecified: Secondary | ICD-10-CM

## 2010-11-30 DIAGNOSIS — F1010 Alcohol abuse, uncomplicated: Secondary | ICD-10-CM

## 2010-11-30 DIAGNOSIS — R112 Nausea with vomiting, unspecified: Secondary | ICD-10-CM

## 2010-11-30 LAB — CBC
MCH: 29.4 pg (ref 26.0–34.0)
MCHC: 34.9 g/dL (ref 30.0–36.0)
MCV: 84.1 fL (ref 78.0–100.0)
Platelets: 156 10*3/uL (ref 150–400)
RBC: 3.27 MIL/uL — ABNORMAL LOW (ref 4.22–5.81)

## 2010-11-30 LAB — GLUCOSE, CAPILLARY: Glucose-Capillary: 134 mg/dL — ABNORMAL HIGH (ref 70–99)

## 2010-12-01 LAB — GLUCOSE, CAPILLARY
Glucose-Capillary: 115 mg/dL — ABNORMAL HIGH (ref 70–99)
Glucose-Capillary: 120 mg/dL — ABNORMAL HIGH (ref 70–99)

## 2010-12-17 NOTE — Discharge Summary (Signed)
NAME:  [name] [name], [name] [name]             ACCOUNT NO.:  [id number]  MEDICAL RECORD NO.:  [id number]  LOCATION:  6523                         FACILITY:  [facility]  PHYSICIAN:  Doneen Poisson, MD     DATE OF BIRTH:  02/15/1951  DATE OF ADMISSION:  11/25/2010 DATE OF DISCHARGE:  12/01/2010                              DISCHARGE SUMMARY   DISCHARGE DIAGNOSES: 1. Acute on chronic pancreatitis. 2. Seizure disorder since 2001 with history of multiple breakthrough     seizures secondary to medication noncompliance and alcohol     withdrawal. 3. Hypertension. 4. Diet-controlled diabetes mellitus. 5. Gastroesophageal reflux disease. 6. Alzheimer or alcohol-induced dementia. 7. History of alcohol abuse. 8. 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 9. Sinus tachycardia.  DISCHARGE MEDICATIONS: 1. Keppra 1500 mg p.o. q.12 h. 2. Lisinopril 20 mg p.o. daily. 3. Metoprolol 50 mg p.o. b.i.d. 4. Protonix 40 mg p.o. daily. 5. Dilantin 300 mg p.o. daily. 6. Thiamine 100 mg p.o. daily. 7. Ativan 0.5-1 mg p.o. q.4 h. p.r.n. 8. Percocet 5/325 one to two tablets p.o. q.6 h. p.r.n. for pain. 9. Zofran 2 mg p.o. q.4 h. p.r.n. 10.Folic acid 1 mg p.o. daily.  DISPOSITION AND FOLLOWUP:  [name] [name] is medically stable for safe discharge.  He will return to [facility] as his wife feels she cannot manage his medical care on her own.  He will follow up with his Neurologist at [facility] Dr. Floreen Comber, number [phone].  Dr. Durenda Age office has been notified of the patient's pending discharge and will confirm a follow-up appointment with his wife, [name] [name] at [phone].  At this appointment, he should have a Dilantin level checked to ensure medication dosages are appropriate.  He will follow up with his primary care physician Dr. Myriam Jacobson, [facility] on Thursday, December 22, 2010 at 3 p.m.  PROCEDURES  DURING HOSPITALIZATION:  He had an abdominal ultrasound which showed normal gallbladder, mild bile duct dilatation of nonspecific etiology and bilateral renal cysts.  CONSULTATIONS DURING HOSPITALIZATION:  None.  CHIEF COMPLAINT:  Abdominal pain and vomiting.  HISTORY OF PRESENT ILLNESS:  [name] [name] is a 60 year old man with chronic pancreatitis and seizure disorder with multiple breakthrough seizures secondary to noncompliance and alcoholism.  His history is significant for a recent admission for breakthrough seizure with discharge on November 23, 2010.  [name] [name] presented from the nursing facility with complaints of abdominal pain and vomiting x 2 days.  PHYSICAL EXAMINATION: VITAL SIGNS:  Temperature 97.6, blood pressure 141/97, respiration 22,  heart rate 104, O2 saturation 98% on room air. GENERAL APPEARANCE:  Cachectic, elderly-appearing man, sleeping in bed. HEENT:  Pupils equal, round and reactive to light and accommodation.   Extraocular muscles intact.  No scleral icterus. CARDIAC:  Regular rate and rhythm.  No rubs, murmurs, or gallops. PULMONARY:  Clear to auscultation bilaterally. ABDOMEN:  Soft, nondistended, present bowel sounds.  Tenderness in the epigastric region. EXTREMITIES:  Warm, well perfused.  No pedal edema. NEUROLOGIC:  Alert and oriented x 1, unable to provide his name or the date.  Cranial nerves II through XII are grossly intact.  Somnolent,  but arousable, did not test sensation/strength due to difficulty with  commands.  LABS:  Sodium 133, potassium 3.5, chloride 85, bicarbonate 27, BUN 26,  creatinine 2.98, glucose 393.  Calcium 10.3, total protein 8.9, albumin  5.1.  AST 20, ALT 33, alkaline phosphatase 160, total bilirubin 0.23, eGFR 26, phenytoin < 2.5, lipase 88.  86% neutrophils.  White blood cell  count 28.8, hemoglobin 18.1, hematocrit 50.1, platelets 373.  Alcohol < 11.  Magnesium 2.4, phosphorus 5.5.  Total CK 49, CK-MB 2.3, troponin  < 0.30.  EKG sinus tachycardia.  HOSPITAL COURSE: 1. [name] [name] presented to the [facility] Emergency Department from [facility] with complaints of abdominal pain and vomiting x 2 days.     Abdominal pain was likely secondary to acute on chronic pancreatitis,      which was supported by his elevated lipase and physical exam findings.       He remained n.p.o. for several days during the hospitalization with      gradual advancement of his diet.  He continues to have mild abdominal      pain on the day of discharge which is his baseline.  It characterized      as a "8."  He was treated with morphine IV for pain control, which was      converted to Percocet p.o. on discharged.  2. Seizure disorder.  [name] [name] remained seizure-free on     Keppra and Dilantin.  He will have follow up with his neurologist     as scheduled.  3. Hypertension.  [name] [name] continued to have elevated systolic     blood pressures and was started on metoprolol initially 25 mg p.o.     b.i.d.  By discharge, he was managed with metoprolol 50 mg p.o.     b.i.d. and we resumed his admission medication of lisinopril 20 mg     one p.o. by mouth daily.  4. Tachycardia.  [name] [name] was monitored on telemetry throughout his     stay and found to be tachycardic upon initiation of beta-blocker     starting with 25 mg p.o. b.i.d. of metoprolol.  He returned to     a normal for rate by time at discharge.  5. Diabetes mellitus.  [name] [name]'s glucose was controlled with     sliding scale insulin and diet.  Hemoglobin A1c was 6.5.  6. Gastroesophageal reflux disease.  [name] [name] was to manage on     Protonix 40 mg daily p.o. at the time of discharge.  7. Alzheimer versus alcohol-induced dementia.  [name] [name] was at his     baseline at the time of discharge per his wife.  He is awake,     alert, reasonably oriented.  Given his dementia and the state of     his physical health, his wife was  concerned about the multiple steps     in his home and it was decided to discharge him to a skilled nursing      facility to ensure his safety, medication compliance and abstinence      from alcohol.  8. Acute kidney injury.  His kidney function continued to improve     with rehydration.  The creatinine trended towards normal by discharge      at 1.39, down from 2.98 on admission.  DISCHARGE VITAL SIGNS:  Temperature 98.9, pulse 74, respirations 22, blood pressure 167/83, O2 saturation 98% on room air.  DISCHARGE LABS:  White blood cell count 6.1, hemoglobin 9.6, hematocrit 27.5, platelets 156.  Urine culture no growth.  Sodium 143, potassium 4.5, chloride  109, bicarbonate 25, BUN 69, creatinine 1.39, glucose 165.  eGFR > 60.   ______________________________ Kristie Cowman, MD   ______________________________ Doneen Poisson, MD   KS/MEDQ  D:  12/01/2010  T:  12/01/2010  Job:  [id number]  cc:   Dr. Jonne Ply. Phineas Real, MD  Electronically Signed by Kristie Cowman MD on 12/12/2010 12:59:52 PM Electronically Signed by Doneen Poisson  on 12/17/2010 11:33:11 AM

## 2010-12-27 ENCOUNTER — Emergency Department: Payer: Self-pay | Admitting: Emergency Medicine

## 2010-12-30 LAB — COMPREHENSIVE METABOLIC PANEL
ALT: 24
AST: 23
CO2: 29
Calcium: 9.3
GFR calc Af Amer: >60
Sodium: 138
Total Protein: 7.1

## 2010-12-30 LAB — DIFFERENTIAL
Eosinophils Absolute: 0 — ABNORMAL LOW
Eosinophils Relative: 0
Lymphs Abs: 1.3
Monocytes Relative: 7

## 2010-12-30 LAB — CBC
MCHC: 33.4
RBC: 5.47
RDW: 14

## 2011-01-15 ENCOUNTER — Inpatient Hospital Stay (HOSPITAL_COMMUNITY)
Admission: EM | Admit: 2011-01-15 | Discharge: 2011-01-20 | DRG: 071 | Disposition: A | Discharge status: Skilled Nursing Facility | Payer: Medicare Other | Attending: Internal Medicine | Admitting: Internal Medicine

## 2011-01-15 ENCOUNTER — Emergency Department (HOSPITAL_COMMUNITY): Payer: Medicare Other

## 2011-01-15 DIAGNOSIS — F1010 Alcohol abuse, uncomplicated: Secondary | ICD-10-CM | POA: Diagnosis present

## 2011-01-15 DIAGNOSIS — R197 Diarrhea, unspecified: Secondary | ICD-10-CM | POA: Diagnosis present

## 2011-01-15 DIAGNOSIS — Z79899 Other long term (current) drug therapy: Secondary | ICD-10-CM

## 2011-01-15 DIAGNOSIS — Z7982 Long term (current) use of aspirin: Secondary | ICD-10-CM

## 2011-01-15 DIAGNOSIS — G40909 Epilepsy, unspecified, not intractable, without status epilepticus: Secondary | ICD-10-CM | POA: Diagnosis present

## 2011-01-15 DIAGNOSIS — E876 Hypokalemia: Secondary | ICD-10-CM | POA: Diagnosis present

## 2011-01-15 DIAGNOSIS — F0390 Unspecified dementia without behavioral disturbance: Secondary | ICD-10-CM | POA: Diagnosis present

## 2011-01-15 DIAGNOSIS — G9349 Other encephalopathy: Principal | ICD-10-CM | POA: Diagnosis present

## 2011-01-15 DIAGNOSIS — K861 Other chronic pancreatitis: Secondary | ICD-10-CM | POA: Diagnosis present

## 2011-01-15 LAB — COMPREHENSIVE METABOLIC PANEL
AST: 26 U/L (ref 0–37)
Albumin: 3.6 g/dL (ref 3.5–5.2)
BUN: 17 mg/dL (ref 6–23)
Calcium: 9.6 mg/dL (ref 8.4–10.5)
Chloride: 97 mEq/L (ref 96–112)
Creatinine, Ser: 0.86 mg/dL (ref 0.50–1.35)
Total Protein: 7.1 g/dL (ref 6.0–8.3)

## 2011-01-15 LAB — RAPID URINE DRUG SCREEN, HOSP PERFORMED
Barbiturates: NOT DETECTED
Benzodiazepines: NOT DETECTED
Cocaine: NOT DETECTED
Opiates: NOT DETECTED

## 2011-01-15 LAB — CBC
HCT: 40.4 % (ref 39.0–52.0)
MCHC: 33.4 g/dL (ref 30.0–36.0)
MCV: 88.4 fL (ref 78.0–100.0)
Platelets: 270 10*3/uL (ref 150–400)
RDW: 14.3 % (ref 11.5–15.5)
WBC: 17.7 10*3/uL — ABNORMAL HIGH (ref 4.0–10.5)

## 2011-01-15 LAB — PHENYTOIN LEVEL, TOTAL: Phenytoin Lvl: <2.5 ug/mL — ABNORMAL LOW (ref 10.0–20.0)

## 2011-01-15 LAB — LIPASE, BLOOD: Lipase: 54 U/L (ref 11–59)

## 2011-01-15 LAB — ETHANOL: Alcohol, Ethyl (B): <11 mg/dL (ref 0–11)

## 2011-01-16 ENCOUNTER — Inpatient Hospital Stay (HOSPITAL_COMMUNITY): Payer: Medicare Other

## 2011-01-16 LAB — CBC
Hemoglobin: 13 g/dL (ref 13.0–17.0)
MCHC: 33.9 g/dL (ref 30.0–36.0)
RDW: 14.4 % (ref 11.5–15.5)
WBC: 12.6 10*3/uL — ABNORMAL HIGH (ref 4.0–10.5)

## 2011-01-16 LAB — DIFFERENTIAL
Basophils Absolute: 0 10*3/uL (ref 0.0–0.1)
Basophils Relative: 0 % (ref 0–1)
Lymphocytes Relative: 9 % — ABNORMAL LOW (ref 12–46)
Monocytes Absolute: 1.4 10*3/uL — ABNORMAL HIGH (ref 0.1–1.0)
Neutro Abs: 10 10*3/uL — ABNORMAL HIGH (ref 1.7–7.7)

## 2011-01-16 LAB — APTT: aPTT: 22 seconds — ABNORMAL LOW (ref 24–37)

## 2011-01-16 LAB — URINALYSIS, ROUTINE W REFLEX MICROSCOPIC
Bilirubin Urine: NEGATIVE
Glucose, UA: 250 mg/dL — AB
Hgb urine dipstick: NEGATIVE
Ketones, ur: NEGATIVE mg/dL
Nitrite: NEGATIVE
Specific Gravity, Urine: 1.025 (ref 1.005–1.030)
pH: 7 (ref 5.0–8.0)

## 2011-01-16 LAB — URINE MICROSCOPIC-ADD ON

## 2011-01-16 LAB — PROTIME-INR
INR: 1.36 (ref 0.00–1.49)
Prothrombin Time: 17 seconds — ABNORMAL HIGH (ref 11.6–15.2)

## 2011-01-16 LAB — GLUCOSE, CAPILLARY: Glucose-Capillary: 96 mg/dL (ref 70–99)

## 2011-01-17 ENCOUNTER — Inpatient Hospital Stay (HOSPITAL_COMMUNITY): Payer: Medicare Other

## 2011-01-17 LAB — CBC
HCT: 34.5 % — ABNORMAL LOW (ref 39.0–52.0)
Hemoglobin: 11.3 g/dL — ABNORMAL LOW (ref 13.0–17.0)
RBC: 3.94 MIL/uL — ABNORMAL LOW (ref 4.22–5.81)

## 2011-01-17 LAB — BASIC METABOLIC PANEL
BUN: 27 mg/dL — ABNORMAL HIGH (ref 6–23)
CO2: 28 mEq/L (ref 19–32)
Calcium: 8.8 mg/dL (ref 8.4–10.5)
Glucose, Bld: 113 mg/dL — ABNORMAL HIGH (ref 70–99)
Potassium: 3.2 mEq/L — ABNORMAL LOW (ref 3.5–5.1)
Sodium: 143 mEq/L (ref 135–145)

## 2011-01-17 LAB — GLUCOSE, CAPILLARY
Glucose-Capillary: 107 mg/dL — ABNORMAL HIGH (ref 70–99)
Glucose-Capillary: 122 mg/dL — ABNORMAL HIGH (ref 70–99)

## 2011-01-17 LAB — PHOSPHORUS: Phosphorus: 3.5 mg/dL (ref 2.3–4.6)

## 2011-01-18 LAB — URINE CULTURE

## 2011-01-18 LAB — BASIC METABOLIC PANEL
BUN: 22 mg/dL (ref 6–23)
Calcium: 8.4 mg/dL (ref 8.4–10.5)
Creatinine, Ser: 0.51 mg/dL (ref 0.50–1.35)
GFR calc non Af Amer: >90 mL/min (ref >90)
Glucose, Bld: 132 mg/dL — ABNORMAL HIGH (ref 70–99)

## 2011-01-18 LAB — GLUCOSE, CAPILLARY: Glucose-Capillary: 114 mg/dL — ABNORMAL HIGH (ref 70–99)

## 2011-01-19 LAB — BASIC METABOLIC PANEL
BUN: 20 mg/dL (ref 6–23)
CO2: 22 mEq/L (ref 19–32)
Chloride: 105 mEq/L (ref 96–112)
Creatinine, Ser: 0.45 mg/dL — ABNORMAL LOW (ref 0.50–1.35)

## 2011-01-19 LAB — GLUCOSE, CAPILLARY: Glucose-Capillary: 129 mg/dL — ABNORMAL HIGH (ref 70–99)

## 2011-01-19 LAB — CLOSTRIDIUM DIFFICILE BY PCR: Toxigenic C. Difficile by PCR: NEGATIVE

## 2011-01-20 LAB — BASIC METABOLIC PANEL
GFR calc Af Amer: >90 mL/min (ref >90)
GFR calc non Af Amer: >90 mL/min (ref >90)
Glucose, Bld: 124 mg/dL — ABNORMAL HIGH (ref 70–99)
Potassium: 3.8 mEq/L (ref 3.5–5.1)
Sodium: 134 mEq/L — ABNORMAL LOW (ref 135–145)

## 2011-01-20 LAB — GLUCOSE, CAPILLARY
Glucose-Capillary: 139 mg/dL — ABNORMAL HIGH (ref 70–99)
Glucose-Capillary: 132 mg/dL — ABNORMAL HIGH (ref 70–99)

## 2011-01-20 LAB — CBC
Hemoglobin: 12.6 g/dL — ABNORMAL LOW (ref 13.0–17.0)
MCHC: 34.1 g/dL (ref 30.0–36.0)

## 2011-01-20 LAB — MAGNESIUM: Magnesium: 1.6 mg/dL (ref 1.5–2.5)

## 2011-01-20 NOTE — H&P (Signed)
NAME:  [name] [name], [name] [name]             ACCOUNT NO.:  [id number]  MEDICAL RECORD NO.:  [id number]  LOCATION:  5502                         FACILITY:  [facility]  PHYSICIAN:  Manson Passey, MD        DATE OF BIRTH:  04-26-1950  DATE OF ADMISSION:  01/15/2011 DATE OF DISCHARGE:                             HISTORY & PHYSICAL   PRIMARY CARE PHYSICIAN:  Unassigned.  CHIEF COMPLAINT:  Altered mental status.  HISTORY OF PRESENT ILLNESS:  The patient is a 60 year old male with a history of alcohol abuse, pancreatitis, seizure disorder brought by family member for worsening mental status.  The patient was unable to provide history due to his mental status.  History was obtained from e- chart as well as family member.  As per family member, the patient has a history of altered mental status/dementia due to his chronic history of alcohol abuse.  Over the last few weeks, the family member reports that the patient has been increasingly confused and the family member is unable to take care of the patient, for which reason he was brought to the emergency department.  The patient was admitted to hospitalist service for further evaluation and management.  The patient has no complaints of chest pain, cough, palpitations, fever or chills.  No complaints of nausea or vomiting or abdominal pain.  No complaints of blood in the stool or in the urine.  No reported history of loss of consciousness or dizziness.  PAST HISTORY: 1. Alcohol abuse. 2. Pancreatitis. 3. Seizure disorder. 4. Altered mental status. 5. Dementia.  PAST SURGICAL HISTORY:  Unknown.  FAMILY HISTORY:  The patient has no significant medical family history.  SOCIAL HISTORY:  The patient has been abusing alcohol for many years, no illegal drug use or smoking.  ALLERGIES:  No known drug allergies.  MEDICATIONS:  Family member was unable to provide list of medications.  REVIEW OF SYSTEMS:  As per HPI.  PHYSICAL EXAMINATION:  VITAL  SIGNS:  Blood pressure 142/77, pulse 87, respirations 17, temperature 97.7 Fahrenheit, oxygen saturation 96% on 2 L nasal cannula. GENERAL APPEARANCE:  No acute distress, the patient appears cachectic and comfortable. HEENT:  Normocephalic/atraumatic, pupils equally round, reactive to light and accommodation, extraocular muscles unable to test because the patient does not follow commands, no tonsillar erythema or exudates. NECK:  Supple, no lymphadenopathy, no JVD, no carotid bruits appreciated. CHEST:  Bilateral air entry clear to auscultation, no wheezing, no rales, no rhonchi. HEART:  Positive S1, S2, regular rhythm and rate.  No murmurs. ABDOMEN:  Positive bowel sounds, soft, nontender/nondistended. EXTREMITIES:  Pulses palpable bilaterally, no lower extremity edema. SKIN:  Warm, dry. NEUROLOGIC:  The patient is awake, but disoriented, no focal neurologic deficits.  DIAGNOSTIC IMAGING:  CAT scan of the head without contrast is within the normal limits.  LABORATORY FINDINGS:  Sodium 143, potassium 3, chloride 97, bicarb 26, BUN 17, creatinine 0.86, glucose 204, white blood cells 17.7, hematocrit 40, hemoglobin 13.5, platelets 270.  ASSESSMENT AND PLAN: 1. Altered mental status.  Likely due to worsening dementia versus     alcohol intoxication.  We will check alcohol level and ammonia     level.  We will  consider psych evaluation if mental status does not     improve.  Social work will be involved for safe discharge plan.  We     will obtain physical therapy evaluation, occupational therapy and     speech and swallow evaluation. 2. Leukocytosis.  As of right now etiology of leukocytosis is unclear,     but we will obtain chest x-ray as well as blood cultures and will     follow up with a CBC.  On admission, urinalysis is negative and the     patient remains afebrile with a maximum temperature of 97.7     Fahrenheit. 3. Hypokalemia.  We will supplement with potassium 40 mEq 1  dose. 4. Deep vein thrombosis prophylaxis.  We will give Lovenox subcu 40 mg     daily. 5. Diet, NPO for now until speech and swallow evaluation. 6. Advance directives.  Full code. 7. Education.  The patient's family is aware of plan of care.  Time spent admitting the patient more than 35 minutes.          ______________________________ Manson Passey, MD     AD/MEDQ  D:  01/16/2011  T:  01/17/2011  Job:  [id number]  Electronically Signed by Manson Passey MD on 01/20/2011 03:11:47 PM

## 2011-01-22 ENCOUNTER — Other Ambulatory Visit: Payer: Self-pay | Admitting: Geriatric Medicine

## 2011-01-26 ENCOUNTER — Other Ambulatory Visit: Payer: Self-pay | Admitting: Geriatric Medicine

## 2011-01-26 NOTE — Discharge Summary (Signed)
NAME:  [name] [name], [name] [name]             ACCOUNT NO.:  [id number]  MEDICAL RECORD NO.:  [id number]  LOCATION:  5530                         FACILITY:  [facility]  PHYSICIAN:  Kathlen Mody, MD       DATE OF BIRTH:  05/24/1950  DATE OF ADMISSION:  01/15/2011 DATE OF DISCHARGE:  01/20/2011                        DISCHARGE SUMMARY - REFERRING   DISCHARGE DIAGNOSES: 1. Altered mental status secondary to baseline dementia with continued     alcohol abuse and alcoholic encephalopathy. 2. Hypokalemia. 3. Mild diarrhea. 4. Chronic abdominal pain, consistent with chronic pancreatitis. 5. Type 2 diabetes.  HISTORY AND BRIEF HOSPITAL COURSE:  Mr. Bouwman is a 60 year old male with a history of alcohol abuse, pancreatitis, and seizure disorder who was brought by a family member to the emergency department for worsening mental status.  It had gotten to the point where the family member was unable to care for the patient at home.  The family member reported that the patient had continued alcohol abuse and has been becoming increasingly confused.  The patient himself denied any complaints of chest pain, cough, palpitations, fever, or chills; however, he does have chronic abdominal pain, for which he is on Percocet and Tylenol.  The [facility] hospital[facility] were called for admission, further evaluation, workup and likely skilled nursing facility placement for this patient who his family was unable to care for him at home anymore.  The patient was treated with IV fluids in time predominantly, his mental status improved.  He was seen by Physical Therapy and Occupational Therapy to definitively recommended skilled nursing facility placement for rehabilitation for this patient.    During his stay here, he had no seizures and was maintained on Keppra.  He did develop diarrhea, which was tested via C. diff PCR and found to be negative.  Consequently, his diarrhea will be treated with empiric Cipro, Flagyl.   Further, the patient had an abnormal urinary analysis  which showed 20,000 colonies of group B strep.  Normally, this would not be treated as the urinary tract infection; however, because the patient's white count had normalized on October 23 to 7.9 and has now rebounded to 12.3, we will go ahead and treat the urinary tract infection and the diarrhea with a combination of Cipro, Flagyl empirically for 7 days.  The patient shows no other signs of infection.  At this point in time, he complains only of abdominal pain which he says is chronic in nature, and 2 watery bowel movements daily.  PHYSICAL EXAMINATION:  GENERAL:  At the time of discharge, the patient is alert and oriented, although he is quiet and appears depressed. He appears very thin and somewhat diminished. VITAL SIGNS:  Current temperature 97.3 pulse 76, respirations 18, blood pressure 127/81, O2 sats 99% on room air. HEENT:  Head is atraumatic, normocephalic.  Eyes have muddied sclerae, but pupils are equal and round.  Nose shows no nasal discharge or exterior lesions.  Mouth has moist mucous membranes.  NECK:  Supple with midline trachea.  No JVD.  No lymphadenopathy. CHEST:  Demonstrates no accessory muscle use.  He has no wheezes or crackles to my exam. HEART:  Has a regular rate and rhythm without obvious  murmurs, rubs, or gallops. ABDOMEN:  Thin, soft, nontender, nondistended with good bowel sounds. EXTREMITIES:  Very thin, but show no clubbing, cyanosis, or edema. PSYCHIATRIC:  Again, the patient's mood seems somewhat depressed, but he is alert and oriented x3 this morning with no signs of altered mental status.  PERTINENT LABS THIS HOSPITALIZATION:  On admission, the patient did have a white count of 17.7.  His urine showed 250 of glucose, otherwise was negative for signs of infection.  Urine culture did reveal 20,000 colonies of group B strep.  On October 24, the patient had a C. diff PCR, which was  negative.  Today at the time of discharge labs are as follows:  WBC 12.3, hemoglobin 12.6, hematocrit 37.0, platelets 285.  Sodium 134 potassium 3.8, chloride 100, bicarb 25, glucose 124, BUN 21, creatinine 0.47, serum calcium 8.5, serum magnesium 1.6.  PERTINENT RADIOLOGICAL EXAMS:  On October 21, the patient had a CT head without contrast for altered mental status that showed no acute intracranial pathology and moderate cortical volume loss, scattered small vessel ischemic microangiopathy.  On October 23, he had a portable chest x-ray that shows low lung volumes.  No acute disease.  DISCHARGE MEDICATIONS: 1. Acetaminophen 325 mg 2 tablets by mouth every 4 hours as needed for     pain. 2. Albuterol 2.5 mg in 3 mL nebulizer solution inhaled every 8 hours     as needed for shortness of breath. 3. Cipro 500 mg by mouth twice daily for 7 days. 4. Flagyl 500 mg by mouth 3 times a day for 7 days. 5. Imodium 2 mg by mouth twice daily as needed for watery diarrhea,     take for no more than 3 days. 6. Aricept 10 mg 1 tablet by mouth daily at bedtime. 7. Aspirin 81 mg 1 tablet by mouth daily. 8. Creon 1 capsule by mouth 3 times a day with meals. 9. Keppra 100 mg oral solution, take 15 mg by mouth twice daily. 10.Namenda 5 mg tablet by mouth once daily. 11.Norvasc 5 mg tablet by mouth once daily. 12.Percocet 5/325 one tablet by mouth every 6 hours as needed for     pain.  BRIEF DISCHARGE INSTRUCTIONS:  The patient will be discharged to skilled nursing facility.  Activity will be per physical therapy and occupational therapy at the skilled nursing facility.  Diet:  We recommend small low-fat low-fiber meals 4-5 times daily.  We also recommend Ensure or boost supplementation t.i.d.  Wound care is not applicable.  The patient needs to be seen by the primary care physician at the facility within 1-2 days to check his serum potassium and his diarrhea.     Stephani Police,  PA   ______________________________ Kathlen Mody, MD    MLY/MEDQ  D:  01/20/2011  T:  01/20/2011  Job:  [id number]  Electronically Signed by Algis Downs PA on 01/20/2011 02:49:21 PM Electronically Signed by Kathlen Mody MD on 01/26/2011 09:48:17 PM

## 2011-01-27 ENCOUNTER — Inpatient Hospital Stay: Payer: Self-pay | Admitting: Internal Medicine

## 2011-02-17 ENCOUNTER — Emergency Department: Payer: Self-pay | Admitting: Internal Medicine

## 2011-02-22 ENCOUNTER — Encounter (HOSPITAL_COMMUNITY): Payer: Self-pay | Admitting: Emergency Medicine

## 2011-02-22 ENCOUNTER — Inpatient Hospital Stay (HOSPITAL_COMMUNITY)
Admission: EM | Admit: 2011-02-22 | Discharge: 2011-02-28 | DRG: 100 | Disposition: A | Discharge status: Home Health | Payer: Medicare Other | Source: Ambulatory Visit | Attending: Internal Medicine | Admitting: Internal Medicine

## 2011-02-22 DIAGNOSIS — D649 Anemia, unspecified: Secondary | ICD-10-CM | POA: Diagnosis present

## 2011-02-22 DIAGNOSIS — G9340 Encephalopathy, unspecified: Secondary | ICD-10-CM | POA: Diagnosis present

## 2011-02-22 DIAGNOSIS — R739 Hyperglycemia, unspecified: Secondary | ICD-10-CM | POA: Diagnosis present

## 2011-02-22 DIAGNOSIS — E119 Type 2 diabetes mellitus without complications: Secondary | ICD-10-CM | POA: Diagnosis present

## 2011-02-22 DIAGNOSIS — Z9119 Patient's noncompliance with other medical treatment and regimen: Secondary | ICD-10-CM

## 2011-02-22 DIAGNOSIS — K861 Other chronic pancreatitis: Secondary | ICD-10-CM | POA: Diagnosis present

## 2011-02-22 DIAGNOSIS — Z87820 Personal history of traumatic brain injury: Secondary | ICD-10-CM

## 2011-02-22 DIAGNOSIS — R4182 Altered mental status, unspecified: Secondary | ICD-10-CM

## 2011-02-22 DIAGNOSIS — Z8673 Personal history of transient ischemic attack (TIA), and cerebral infarction without residual deficits: Secondary | ICD-10-CM

## 2011-02-22 DIAGNOSIS — R569 Unspecified convulsions: Secondary | ICD-10-CM | POA: Diagnosis present

## 2011-02-22 DIAGNOSIS — F1010 Alcohol abuse, uncomplicated: Secondary | ICD-10-CM

## 2011-02-22 DIAGNOSIS — R911 Solitary pulmonary nodule: Secondary | ICD-10-CM | POA: Diagnosis present

## 2011-02-22 DIAGNOSIS — K219 Gastro-esophageal reflux disease without esophagitis: Secondary | ICD-10-CM | POA: Diagnosis present

## 2011-02-22 DIAGNOSIS — G40209 Localization-related (focal) (partial) symptomatic epilepsy and epileptic syndromes with complex partial seizures, not intractable, without status epilepticus: Principal | ICD-10-CM | POA: Diagnosis present

## 2011-02-22 DIAGNOSIS — Z7982 Long term (current) use of aspirin: Secondary | ICD-10-CM

## 2011-02-22 DIAGNOSIS — Z91199 Patient's noncompliance with other medical treatment and regimen due to unspecified reason: Secondary | ICD-10-CM

## 2011-02-22 DIAGNOSIS — F1020 Alcohol dependence, uncomplicated: Secondary | ICD-10-CM | POA: Diagnosis present

## 2011-02-22 DIAGNOSIS — D72829 Elevated white blood cell count, unspecified: Secondary | ICD-10-CM | POA: Diagnosis present

## 2011-02-22 DIAGNOSIS — I10 Essential (primary) hypertension: Secondary | ICD-10-CM | POA: Diagnosis present

## 2011-02-22 DIAGNOSIS — F1027 Alcohol dependence with alcohol-induced persisting dementia: Secondary | ICD-10-CM | POA: Diagnosis present

## 2011-02-22 DIAGNOSIS — Z79899 Other long term (current) drug therapy: Secondary | ICD-10-CM

## 2011-02-22 DIAGNOSIS — E876 Hypokalemia: Secondary | ICD-10-CM | POA: Diagnosis present

## 2011-02-22 HISTORY — DX: Other chronic pancreatitis: K86.1

## 2011-02-22 HISTORY — DX: Ulcer of esophagus without bleeding: K22.10

## 2011-02-22 HISTORY — DX: Solitary pulmonary nodule: R91.1

## 2011-02-22 MED ORDER — LORAZEPAM 2 MG/ML IJ SOLN
1.0000 mg | Freq: Once | INTRAMUSCULAR | Status: DC
  Filled 2011-02-22: qty 1

## 2011-02-22 MED ORDER — SODIUM CHLORIDE 0.9 % IV SOLN
1500.0000 mg | INTRAVENOUS | Status: AC
  Administered 2011-02-23: 1500 mg via INTRAVENOUS
  Filled 2011-02-22: qty 15

## 2011-02-22 NOTE — ED Notes (Signed)
Pt was at home when family sts he had a grand mal seizure. EMS noted pt to be post-ictal with incontinence. Pt is responding better at this time. Pt also has c/o abd pain.

## 2011-02-23 ENCOUNTER — Emergency Department (HOSPITAL_COMMUNITY): Payer: Medicare Other

## 2011-02-23 ENCOUNTER — Encounter (HOSPITAL_COMMUNITY): Payer: Self-pay | Admitting: Radiology

## 2011-02-23 DIAGNOSIS — G9340 Encephalopathy, unspecified: Secondary | ICD-10-CM | POA: Diagnosis present

## 2011-02-23 DIAGNOSIS — D72829 Elevated white blood cell count, unspecified: Secondary | ICD-10-CM | POA: Diagnosis present

## 2011-02-23 DIAGNOSIS — R739 Hyperglycemia, unspecified: Secondary | ICD-10-CM | POA: Diagnosis present

## 2011-02-23 DIAGNOSIS — I10 Essential (primary) hypertension: Secondary | ICD-10-CM | POA: Diagnosis present

## 2011-02-23 DIAGNOSIS — F1010 Alcohol abuse, uncomplicated: Secondary | ICD-10-CM | POA: Diagnosis present

## 2011-02-23 DIAGNOSIS — R569 Unspecified convulsions: Secondary | ICD-10-CM | POA: Diagnosis present

## 2011-02-23 DIAGNOSIS — F1027 Alcohol dependence with alcohol-induced persisting dementia: Secondary | ICD-10-CM | POA: Diagnosis present

## 2011-02-23 DIAGNOSIS — E876 Hypokalemia: Secondary | ICD-10-CM | POA: Diagnosis present

## 2011-02-23 LAB — POCT I-STAT 3, ART BLOOD GAS (G3+)
Acid-Base Excess: 2 mmol/L (ref 0.0–2.0)
Bicarbonate: 25.7 mEq/L — ABNORMAL HIGH (ref 20.0–24.0)
pCO2 arterial: 34.4 mmHg — ABNORMAL LOW (ref 35.0–45.0)
pO2, Arterial: 86 mmHg (ref 80.0–100.0)

## 2011-02-23 LAB — URINALYSIS, ROUTINE W REFLEX MICROSCOPIC
Bilirubin Urine: NEGATIVE
Ketones, ur: NEGATIVE mg/dL
Nitrite: NEGATIVE
Protein, ur: NEGATIVE mg/dL
Urobilinogen, UA: 1 mg/dL (ref 0.0–1.0)
pH: 6 (ref 5.0–8.0)

## 2011-02-23 LAB — COMPREHENSIVE METABOLIC PANEL
ALT: 19 U/L (ref 0–53)
AST: 16 U/L (ref 0–37)
Albumin: 3.4 g/dL — ABNORMAL LOW (ref 3.5–5.2)
Alkaline Phosphatase: 123 U/L — ABNORMAL HIGH (ref 39–117)
BUN: 11 mg/dL (ref 6–23)
CO2: 28 mEq/L (ref 19–32)
Chloride: 97 mEq/L (ref 96–112)
Creatinine, Ser: 0.67 mg/dL (ref 0.50–1.35)
GFR calc Af Amer: >90 mL/min (ref >90)
GFR calc Af Amer: >90 mL/min (ref >90)
GFR calc non Af Amer: >90 mL/min (ref >90)
Glucose, Bld: 117 mg/dL — ABNORMAL HIGH (ref 70–99)
Potassium: 2.2 mEq/L — CL (ref 3.5–5.1)
Sodium: 140 mEq/L (ref 135–145)
Total Bilirubin: 0.3 mg/dL (ref 0.3–1.2)
Total Protein: 6.7 g/dL (ref 6.0–8.3)
Total Protein: 6 g/dL (ref 6.0–8.3)

## 2011-02-23 LAB — URINE MICROSCOPIC-ADD ON

## 2011-02-23 LAB — CBC
HCT: 36.5 % — ABNORMAL LOW (ref 39.0–52.0)
HCT: 40.2 % (ref 39.0–52.0)
MCH: 26.7 pg (ref 26.0–34.0)
MCHC: 31.8 g/dL (ref 30.0–36.0)
MCHC: 32.3 g/dL (ref 30.0–36.0)
MCV: 85 fL (ref 78.0–100.0)
RDW: 15.4 % (ref 11.5–15.5)
RDW: 15.4 % (ref 11.5–15.5)

## 2011-02-23 LAB — GLUCOSE, CAPILLARY
Glucose-Capillary: 121 mg/dL — ABNORMAL HIGH (ref 70–99)
Glucose-Capillary: 114 mg/dL — ABNORMAL HIGH (ref 70–99)
Glucose-Capillary: 103 mg/dL — ABNORMAL HIGH (ref 70–99)
Glucose-Capillary: 123 mg/dL — ABNORMAL HIGH (ref 70–99)
Glucose-Capillary: 147 mg/dL — ABNORMAL HIGH (ref 70–99)

## 2011-02-23 LAB — LACTIC ACID, PLASMA: Lactic Acid, Venous: 1.3 mmol/L (ref 0.5–2.2)

## 2011-02-23 LAB — TSH: TSH: 2.107 u[IU]/mL (ref 0.350–4.500)

## 2011-02-23 LAB — PROTIME-INR: INR: 1.38 (ref 0.00–1.49)

## 2011-02-23 LAB — ETHANOL: Alcohol, Ethyl (B): <11 mg/dL (ref 0–11)

## 2011-02-23 LAB — POTASSIUM: Potassium: 2.7 mEq/L — CL (ref 3.5–5.1)

## 2011-02-23 MED ORDER — ONDANSETRON HCL 4 MG/2ML IJ SOLN
4.0000 mg | Freq: Four times a day (QID) | INTRAMUSCULAR | Status: DC | PRN
  Administered 2011-02-27 – 2011-02-28 (×2): 4 mg via INTRAVENOUS
  Filled 2011-02-23 (×2): qty 2

## 2011-02-23 MED ORDER — ONDANSETRON HCL 4 MG/2ML IJ SOLN
INTRAMUSCULAR | Status: AC
  Administered 2011-02-23: 4 mg
  Filled 2011-02-23: qty 2

## 2011-02-23 MED ORDER — LORAZEPAM 2 MG/ML IJ SOLN
0.0000 mg | Freq: Four times a day (QID) | INTRAMUSCULAR | Status: AC
  Administered 2011-02-23 – 2011-02-25 (×6): 2 mg via INTRAVENOUS
  Filled 2011-02-23 (×5): qty 1

## 2011-02-23 MED ORDER — PHENYTOIN SODIUM EXTENDED 100 MG PO CAPS
100.0000 mg | ORAL_CAPSULE | Freq: Three times a day (TID) | ORAL | Status: DC
  Filled 2011-02-23 (×3): qty 1

## 2011-02-23 MED ORDER — SODIUM CHLORIDE 0.9 % IV SOLN
1500.0000 mg | Freq: Two times a day (BID) | INTRAVENOUS | Status: DC
  Administered 2011-02-23 – 2011-02-25 (×4): 1500 mg via INTRAVENOUS
  Filled 2011-02-23 (×5): qty 15

## 2011-02-23 MED ORDER — INSULIN ASPART 100 UNIT/ML ~~LOC~~ SOLN
0.0000 [IU] | SUBCUTANEOUS | Status: DC
  Administered 2011-02-23 (×3): 1 [IU] via SUBCUTANEOUS
  Filled 2011-02-23: qty 3

## 2011-02-23 MED ORDER — LORAZEPAM 2 MG/ML IJ SOLN: 0.0000 mg | Freq: Two times a day (BID) | INTRAMUSCULAR | Status: DC

## 2011-02-23 MED ORDER — LORAZEPAM 2 MG/ML IJ SOLN
2.0000 mg | Freq: Once | INTRAMUSCULAR | Status: AC
  Administered 2011-02-23: 2 mg via INTRAMUSCULAR

## 2011-02-23 MED ORDER — LORAZEPAM 1 MG PO TABS: 1.0000 mg | ORAL_TABLET | Freq: Four times a day (QID) | ORAL | Status: DC | PRN

## 2011-02-23 MED ORDER — POTASSIUM CHLORIDE 10 MEQ/100ML IV SOLN
10.0000 meq | INTRAVENOUS | Status: AC
  Administered 2011-02-23 (×3): 10 meq via INTRAVENOUS
  Filled 2011-02-23: qty 300

## 2011-02-23 MED ORDER — PANTOPRAZOLE SODIUM 40 MG IV SOLR
40.0000 mg | Freq: Every day | INTRAVENOUS | Status: DC
  Filled 2011-02-23: qty 40

## 2011-02-23 MED ORDER — PANCRELIPASE (LIP-PROT-AMYL) 12000-38000 UNITS PO CPEP
1.0000 | ORAL_CAPSULE | Freq: Three times a day (TID) | ORAL | Status: DC
  Filled 2011-02-23 (×4): qty 1

## 2011-02-23 MED ORDER — FOLIC ACID 1 MG PO TABS
1.0000 mg | ORAL_TABLET | ORAL | Status: DC
  Filled 2011-02-23: qty 1

## 2011-02-23 MED ORDER — SENNOSIDES-DOCUSATE SODIUM 8.6-50 MG PO TABS
1.0000 | ORAL_TABLET | Freq: Every day | ORAL | Status: DC | PRN
  Administered 2011-02-25: 1 via ORAL
  Filled 2011-02-23: qty 1

## 2011-02-23 MED ORDER — PHENYTOIN SODIUM 50 MG/ML IJ SOLN
100.0000 mg | Freq: Three times a day (TID) | INTRAMUSCULAR | Status: AC
  Administered 2011-02-23 – 2011-02-25 (×6): 100 mg via INTRAVENOUS
  Filled 2011-02-23 (×9): qty 2

## 2011-02-23 MED ORDER — THIAMINE HCL 100 MG/ML IJ SOLN: 100.0000 mg | INTRAMUSCULAR | Status: DC

## 2011-02-23 MED ORDER — DONEPEZIL HCL 5 MG PO TABS
5.0000 mg | ORAL_TABLET | Freq: Every day | ORAL | Status: DC
Start: 2011-02-23 — End: 2011-02-23
  Filled 2011-02-23: qty 1

## 2011-02-23 MED ORDER — SODIUM CHLORIDE 0.9 % IV SOLN
1000.0000 mg | INTRAVENOUS | Status: AC
  Administered 2011-02-23: 1000 mg via INTRAVENOUS
  Filled 2011-02-23: qty 20

## 2011-02-23 MED ORDER — MEMANTINE HCL 5 MG PO TABS
5.0000 mg | ORAL_TABLET | Freq: Every day | ORAL | Status: DC
  Filled 2011-02-23: qty 1

## 2011-02-23 MED ORDER — ALBUTEROL SULFATE (5 MG/ML) 0.5% IN NEBU: 2.5000 mg | INHALATION_SOLUTION | RESPIRATORY_TRACT | Status: DC | PRN

## 2011-02-23 MED ORDER — HYDRALAZINE HCL 20 MG/ML IJ SOLN
10.0000 mg | INTRAMUSCULAR | Status: DC | PRN
  Filled 2011-02-23: qty 1

## 2011-02-23 MED ORDER — GUAIFENESIN-DM 100-10 MG/5ML PO SYRP: 5.0000 mL | ORAL_SOLUTION | ORAL | Status: DC | PRN

## 2011-02-23 MED ORDER — ONDANSETRON HCL 4 MG PO TABS
4.0000 mg | ORAL_TABLET | Freq: Four times a day (QID) | ORAL | Status: DC | PRN
  Administered 2011-02-28: 4 mg via ORAL
  Filled 2011-02-23 (×2): qty 1

## 2011-02-23 MED ORDER — VITAMIN B-1 100 MG PO TABS
100.0000 mg | ORAL_TABLET | ORAL | Status: DC
  Filled 2011-02-23: qty 1

## 2011-02-23 MED ORDER — DEXTROSE 50 % IV SOLN
INTRAVENOUS | Status: AC
  Administered 2011-02-24: 25 mL via INTRAVENOUS
  Filled 2011-02-23: qty 50

## 2011-02-23 MED ORDER — ALUM & MAG HYDROXIDE-SIMETH 200-200-20 MG/5ML PO SUSP: 30.0000 mL | Freq: Four times a day (QID) | ORAL | Status: DC | PRN

## 2011-02-23 MED ORDER — POTASSIUM CHLORIDE IN NACL 20-0.9 MEQ/L-% IV SOLN
INTRAVENOUS | Status: DC
  Administered 2011-02-23: 23:00:00 via INTRAVENOUS
  Filled 2011-02-23 (×3): qty 1000

## 2011-02-23 MED ORDER — POTASSIUM CHLORIDE 10 MEQ/100ML IV SOLN
10.0000 meq | INTRAVENOUS | Status: AC
  Administered 2011-02-23 (×4): 10 meq via INTRAVENOUS
  Filled 2011-02-23: qty 400

## 2011-02-23 MED ORDER — VITAMIN B-1 50 MG PO TABS
250.0000 mg | ORAL_TABLET | Freq: Every day | ORAL | Status: DC
  Filled 2011-02-23: qty 1

## 2011-02-23 MED ORDER — DEXTROSE 50 % IV SOLN
25.0000 mL | Freq: Once | INTRAVENOUS | Status: AC
  Administered 2011-02-24: 25 mL via INTRAVENOUS

## 2011-02-23 MED ORDER — THIAMINE HCL 100 MG/ML IJ SOLN
Freq: Once | INTRAVENOUS | Status: AC
  Administered 2011-02-23: 06:00:00 via INTRAVENOUS
  Filled 2011-02-23 (×2): qty 1000

## 2011-02-23 MED ORDER — AMLODIPINE BESYLATE 5 MG PO TABS
5.0000 mg | ORAL_TABLET | Freq: Every day | ORAL | Status: DC
  Filled 2011-02-23: qty 1

## 2011-02-23 MED ORDER — POTASSIUM CHLORIDE IN NACL 20-0.9 MEQ/L-% IV SOLN
INTRAVENOUS | Status: DC
Start: 2011-02-23 — End: 2011-02-23
  Filled 2011-02-23: qty 1000

## 2011-02-23 MED ORDER — THERA M PLUS PO TABS
1.0000 | ORAL_TABLET | ORAL | Status: DC
  Filled 2011-02-23: qty 1

## 2011-02-23 MED ORDER — LORAZEPAM 2 MG/ML IJ SOLN
1.0000 mg | Freq: Four times a day (QID) | INTRAMUSCULAR | Status: AC | PRN
  Administered 2011-02-23 – 2011-02-24 (×3): 1 mg via INTRAVENOUS
  Filled 2011-02-23 (×4): qty 1

## 2011-02-23 NOTE — ED Notes (Signed)
Pt remains post-ictal.  Nonverbal at this time, not responding to commands.

## 2011-02-23 NOTE — ED Notes (Signed)
Notified by Dr. Patria Mane that pt just had another seizure and is currently postictal. Assessed pt (see RN neuro assessment). Pt's airway secure and SpO2 maintained on RA without interventions.

## 2011-02-23 NOTE — Consult Note (Signed)
Reason for Consult:seizure   CC: seizure  HPI: [name] [name] is an 60 y.o. 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 At present time patient is very agitated and provides no history.  Majority of history obtained from chart.    Patient was brought in by EMS after his family was noted that he had a tonic-clonic seizure. He have had history of distant past. He had alcoholic withdrawal seizures in the past as well. While in emergency department he appeared to be post eked though and then proceeded to have another tonic-clonic seizure witnessed by emergency department physician.  On presentation to ED at 00.29 02/23/2011. His dilantin level was <2.5.  Currently patient is agitated and only follows minimal commands.  He will follow 2 step commands and is moving all extremities purposefully and spontaneously.   Past Medical History  Diagnosis Date  . Seizure disorder 2001     EEG 07/27/10, alcohol abuse & med noncomplliance, began after traumatic subarachnoid hem  . Alcohol abuse   . CVA (cerebral infarction)   . 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 HTN (hypertension)   . GERD (gastroesophageal reflux disease)   . Pancreatitis chronic   . Lung nodule   . Esophageal ulcer without bleeding     History reviewed. No pertinent past surgical history.  Family History  Problem Relation Age of Onset  . Colon cancer Sister     Social History:  reports that he has been smoking.  He does not have any smokeless tobacco history on file. He reports that he drinks  alcohol. His drug history not on file.  No Known Allergies  Medications:  Prior to Admission:  Prescriptions prior to admission  Medication Sig Dispense Refill  . amLODipine (NORVASC) 5 MG tablet Take 5 mg by mouth daily.        Marland Kitchen aspirin 81 MG tablet Take 81 mg by mouth daily.        Marland Kitchen donepezil (ARICEPT) 10 MG tablet Take 5 mg by mouth at bedtime.       . ferrous sulfate 325 (65 FE) MG tablet Take 325 mg by mouth 2 (two) times daily.        . folic acid (FOLVITE) 800 MCG tablet Take 400 mcg by mouth daily.        Marland Kitchen levETIRAcetam (KEPPRA) 100 MG/ML solution Take 1,500 mg by mouth 2 (two) times daily.        Marland Kitchen lisinopril (PRINIVIL,ZESTRIL) 20 MG tablet Take 20 mg by mouth daily.        . memantine (NAMENDA) 5 MG tablet Take 5 mg by mouth daily.       Marland Kitchen omeprazole (PRILOSEC) 40 MG capsule Take 40 mg by mouth 2 (two) times daily.        . ondansetron (ZOFRAN-ODT) 4 MG disintegrating tablet Take 4 mg by mouth every 8 (eight) hours as needed. For vomiting       . Pancrelipase, Lip-Prot-Amyl, (CREON) 6000 UNITS CPEP Take 1 each by mouth 3 (three) times daily.       . phenytoin (DILANTIN) 100 MG ER capsule Take by mouth 3 (three)  times daily.        Marland Kitchen thiamine 250 MG tablet Take 250 mg by mouth daily.         Scheduled:   . insulin aspart  0-9 Units Subcutaneous Q4H  . levetiracetam  1,500 mg Intravenous To Major  . levetiracetam  1,500 mg Intravenous Q12H  . LORazepam  0-4 mg Intravenous Q6H   Followed by  . LORazepam  0-4 mg Intravenous Q12H  . LORazepam  2 mg Intramuscular Once  . ondansetron      . potassium chloride  10 mEq Intravenous Q1 Hr x 3  . general admission iv infusion   Intravenous Once  . DISCONTD: 0.9 % NaCl with KCl 20 mEq / L   Intravenous To Major  . DISCONTD: amLODipine  5 mg Oral Daily  . DISCONTD: donepezil  5 mg Oral QHS  . DISCONTD: folic acid  1 mg Oral To Major  . DISCONTD: lipase/protease/amylase  1 capsule Oral TID WC  . DISCONTD: LORazepam  1 mg  Intravenous Once  . DISCONTD: memantine  5 mg Oral Daily  . DISCONTD: multivitamins ther. w/minerals  1 tablet Oral To Major  . DISCONTD: pantoprazole (PROTONIX) IV  40 mg Intravenous QHS  . DISCONTD: phenytoin  100 mg Oral TID  . DISCONTD: thiamine  100 mg Intravenous To Major  . DISCONTD: thiamine  100 mg Oral To Major  . DISCONTD: thiamine  250 mg Oral Daily    Review of Systems - General ROS: negative for - chills, fatigue, fever or hot flashes Hematological and Lymphatic ROS: negative for - bruising, fatigue, jaundice or pallor Endocrine ROS: negative for - hair pattern changes, hot flashes, mood swings or skin changes Respiratory ROS: negative for - cough, hemoptysis, orthopnea or wheezing Cardiovascular ROS: negative for - dyspnea on exertion, orthopnea, palpitations or shortness of breath Gastrointestinal ROS: negative for - abdominal pain, appetite loss, blood in stools, diarrhea or hematemesis Musculoskeletal ROS: negative for - joint pain, joint stiffness, joint swelling or muscle pain Neurological ROS: per H&P Dermatological ROS: negative for dry skin, pruritus and rash   Blood pressure 129/75, pulse 92, temperature 99.7 F (37.6 C), temperature source Oral, resp. rate 19, weight 49.9 kg (110 lb 0.2 oz), SpO2 97.00%.  Neurologic Examination: Mental Status: Alert, oriented to place but refuses to answer my questions on date and year.  Continues to try to curl up in fetal position and pull covers over his head.  Speech fluent without evidence of aphasia. Able to follow 3 step commands without difficulty. Cranial Nerves: II- Visual fields grossly intact. III/IV/VI-Extraocular movements intact.  Pupils reactive bilaterally. V/VII-Smile symmetric VIII-grossly intact IX/X-normal gag XI-bilateral shoulder shrug XII-midline tongue extension Motor: 5/5 bilaterally with normal tone and bulk Sensory: Pinprick and light touch intact throughout, bilaterally Deep Tendon Reflexes: 2+  and symmetric throughout Plantars: Downgoing bilaterally Cerebellar: Normal finger-to-nose, slow normal heel-to-shin test.     Results for orders placed during the hospital encounter of 02/22/11 (from the past 48 hour(s))  GLUCOSE, CAPILLARY     Status: Abnormal   Collection Time   02/23/11 12:12 AM      Component Value Range Comment   Glucose-Capillary 194 (*) 70 - 99 (mg/dL)   CBC     Status: Abnormal   Collection Time   02/23/11 12:29 AM      Component Value Range Comment   WBC 20.9 (*) 4.0 - 10.5 (K/uL)    RBC 4.73  4.22 - 5.81 (MIL/uL)  Hemoglobin 12.8 (*) 13.0 - 17.0 (g/dL)    HCT 16.1  09.6 - 04.5 (%)    MCV 85.0  78.0 - 100.0 (fL)    MCH 27.1  26.0 - 34.0 (pg)    MCHC 31.8  30.0 - 36.0 (g/dL)    RDW 40.9  81.1 - 91.4 (%)    Platelets 350  150 - 400 (K/uL)   COMPREHENSIVE METABOLIC PANEL     Status: Abnormal   Collection Time   02/23/11 12:29 AM      Component Value Range Comment   Sodium 143  135 - 145 (mEq/L)    Potassium 2.9 (*) 3.5 - 5.1 (mEq/L)    Chloride 97  96 - 112 (mEq/L)    CO2 23  19 - 32 (mEq/L)    Glucose, Bld 224 (*) 70 - 99 (mg/dL)    BUN 11  6 - 23 (mg/dL)    Creatinine, Ser 7.82  0.50 - 1.35 (mg/dL)    Calcium 9.4  8.4 - 10.5 (mg/dL)    Total Protein 6.7  6.0 - 8.3 (g/dL)    Albumin 3.4 (*) 3.5 - 5.2 (g/dL)    AST 20  0 - 37 (U/L)    ALT 22  0 - 53 (U/L)    Alkaline Phosphatase 131 (*) 39 - 117 (U/L)    Total Bilirubin 0.3  0.3 - 1.2 (mg/dL)    GFR calc non Af Amer >90  >90 (mL/min)    GFR calc Af Amer >90  >90 (mL/min)   ETHANOL     Status: Normal   Collection Time   02/23/11 12:29 AM      Component Value Range Comment   Alcohol, Ethyl (B) <11  0 - 11 (mg/dL)   LIPASE, BLOOD     Status: Normal   Collection Time   02/23/11 12:29 AM      Component Value Range Comment   Lipase 26  11 - 59 (U/L)   MRSA PCR SCREENING     Status: Normal   Collection Time   02/23/11  3:32 AM      Component Value Range Comment   MRSA by PCR NEGATIVE   NEGATIVE    POCT I-STAT 3, BLOOD GAS (G3+)     Status: Abnormal   Collection Time   02/23/11  4:13 AM      Component Value Range Comment   pH, Arterial 7.480 (*) 7.350 - 7.450     pCO2 arterial 34.4 (*) 35.0 - 45.0 (mmHg)    pO2, Arterial 86.0  80.0 - 100.0 (mmHg)    Bicarbonate 25.7 (*) 20.0 - 24.0 (mEq/L)    TCO2 27  0 - 100 (mmol/L)    O2 Saturation 97.0      Acid-Base Excess 2.0  0.0 - 2.0 (mmol/L)    Collection site RADIAL, ALLEN'S TEST ACCEPTABLE      Drawn by RT      Sample type ARTERIAL     GLUCOSE, CAPILLARY     Status: Abnormal   Collection Time   02/23/11  4:48 AM      Component Value Range Comment   Glucose-Capillary 121 (*) 70 - 99 (mg/dL)   HEMOGLOBIN N5A     Status: Normal   Collection Time   02/23/11  5:10 AM      Component Value Range Comment   Hemoglobin A1C 5.6  <5.7 (%)    Mean Plasma Glucose 114  <117 (mg/dL)   MAGNESIUM  Status: Normal   Collection Time   02/23/11  5:10 AM      Component Value Range Comment   Magnesium 1.7  1.5 - 2.5 (mg/dL)   PHOSPHORUS     Status: Normal   Collection Time   02/23/11  5:10 AM      Component Value Range Comment   Phosphorus 2.6  2.3 - 4.6 (mg/dL)   TSH     Status: Normal   Collection Time   02/23/11  5:10 AM      Component Value Range Comment   TSH 2.107  0.350 - 4.500 (uIU/mL)   COMPREHENSIVE METABOLIC PANEL     Status: Abnormal   Collection Time   02/23/11  5:10 AM      Component Value Range Comment   Sodium 140  135 - 145 (mEq/L)    Potassium 2.2 (*) 3.5 - 5.1 (mEq/L)    Chloride 102  96 - 112 (mEq/L)    CO2 28  19 - 32 (mEq/L)    Glucose, Bld 117 (*) 70 - 99 (mg/dL)    BUN 11  6 - 23 (mg/dL)    Creatinine, Ser 1.61  0.50 - 1.35 (mg/dL)    Calcium 8.3 (*) 8.4 - 10.5 (mg/dL)    Total Protein 6.0  6.0 - 8.3 (g/dL)    Albumin 3.1 (*) 3.5 - 5.2 (g/dL)    AST 16  0 - 37 (U/L)    ALT 19  0 - 53 (U/L)    Alkaline Phosphatase 123 (*) 39 - 117 (U/L)    Total Bilirubin 0.3  0.3 - 1.2 (mg/dL)    GFR calc non  Af Amer >90  >90 (mL/min)    GFR calc Af Amer >90  >90 (mL/min)   CBC     Status: Abnormal   Collection Time   02/23/11  5:10 AM      Component Value Range Comment   WBC 19.1 (*) 4.0 - 10.5 (K/uL)    RBC 4.42  4.22 - 5.81 (MIL/uL)    Hemoglobin 11.8 (*) 13.0 - 17.0 (g/dL)    HCT 09.6 (*) 04.5 - 52.0 (%)    MCV 82.6  78.0 - 100.0 (fL)    MCH 26.7  26.0 - 34.0 (pg)    MCHC 32.3  30.0 - 36.0 (g/dL)    RDW 40.9  81.1 - 91.4 (%)    Platelets 318  150 - 400 (K/uL)   PROTIME-INR     Status: Abnormal   Collection Time   02/23/11  5:10 AM      Component Value Range Comment   Prothrombin Time 17.2 (*) 11.6 - 15.2 (seconds)    INR 1.38  0.00 - 1.49    APTT     Status: Normal   Collection Time   02/23/11  5:10 AM      Component Value Range Comment   aPTT 31  24 - 37 (seconds)   PHENYTOIN LEVEL, TOTAL     Status: Abnormal   Collection Time   02/23/11  5:10 AM      Component Value Range Comment   Phenytoin Lvl <2.5 (*) 10.0 - 20.0 (ug/mL)   LACTIC ACID, PLASMA     Status: Normal   Collection Time   02/23/11  5:10 AM      Component Value Range Comment   Lactic Acid, Venous 1.3  0.5 - 2.2 (mmol/L)   GLUCOSE, CAPILLARY     Status: Abnormal   Collection Time  02/23/11  8:18 AM      Component Value Range Comment   Glucose-Capillary 114 (*) 70 - 99 (mg/dL)   GLUCOSE, CAPILLARY     Status: Abnormal   Collection Time   02/23/11 11:56 AM      Component Value Range Comment   Glucose-Capillary 103 (*) 70 - 99 (mg/dL)     Dg Chest 2 View  62/13/0865  *RADIOLOGY REPORT*  Clinical Data: Shortness of breath.  CHEST - 2 VIEW  Comparison: Chest radiograph performed 11/19/2010  Findings: The lungs are well-aerated.  Minimal bibasilar opacities are similar to prior studies and likely reflect atelectasis.  There is no evidence of pleural effusion or pneumothorax.  The heart is borderline normal in size; the mediastinal contour is within normal limits.  No acute osseous abnormalities are seen.   IMPRESSION: Minimal bibasilar opacities likely reflect atelectasis.  Original Report Authenticated By: Tonia Ghent, M.D.   Ct Head Wo Contrast  02/23/2011  *RADIOLOGY REPORT*  Clinical Data: Altered mental status; grand mal seizure. Incontinence.  CT HEAD WITHOUT CONTRAST  Technique:  Contiguous axial images were obtained from the base of the skull through the vertex without contrast.  Comparison: CT of the head performed 11/19/2010  Findings: There is no evidence of acute infarction, mass lesion, or intra- or extra-axial hemorrhage on CT.  Evaluation is mildly suboptimal due to motion artifact.  Prominence of the ventricles and sulci reflects mild cortical volume loss.  Scattered periventricular and subcortical white matter change likely reflects small vessel ischemic microangiopathy.  Chronic lacunar infarcts are seen within the basal ganglia bilaterally.  Mild cerebellar atrophy is noted.  The brainstem and fourth ventricle are within normal limits.  The cerebral hemispheres demonstrate grossly normal gray-white differentiation.  No mass effect or midline shift is seen.  There is no evidence of fracture; visualized osseous structures are unremarkable in appearance.  The orbits are within normal limits. There is mild partial opacification of the left ethmoid air cells. The paranasal sinuses and mastoid air cells are well-aerated.  No significant soft tissue abnormalities are seen.    IMPRESSION:  1.  No acute intracranial pathology seen. 2.  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 3.  Persistent mild partial opacification of the left ethmoid air cells.  Original Report Authenticated By: Tonia Ghent, M.D.     Assessment/Plan: 60 YO male with  has a past medical history of Seizure disorder (2001); Alcohol abuse; CVA (cerebral infarction); Dementia; HTN (hypertension); GERD (gastroesophageal reflux  disease); Pancreatitis chronic; Lung nodule; and Esophageal ulcer without bleeding, previous TBI presenting to ED with SZ and subtherapeutic dilantin level.  There is question if patient is taking his medication and some report from wife he is pocketing his medication.    Recommend: (1) IV dilantin 1000 mg now followed by 100 mg IV q 8 hours and switch to PO when swallowing.  (2) Once switched to PO recommend that he be placed on crushable tablet form of Dilantin and given 300 mg q HS at night crushed in food.  (3) If there is question of possibly pocketing his keppra would recommend he be switched to solution form of keppra when able to take PO and continue at home. (4) Dilantin level on 12/1    Felicie Morn PA-C [facility] [phone]  02/23/2011, 3:52 PM

## 2011-02-23 NOTE — H&P (Signed)
PCP:   No primary provider on file.   Chief Complaint:  Seizure HPI: [name] [name] is a 60 y.o. male   has a past medical history of Seizure disorder (2001); Alcohol abuse; CVA (cerebral infarction); Dementia; HTN (hypertension); GERD (gastroesophageal reflux disease); Pancreatitis chronic; Lung nodule; and Esophageal ulcer without bleeding.   Presented with  Patient was brought in by EMS after his family was noted that he had a tonic-clonic seizure. He have had history of distant past. He had alcoholic withdrawal seizures in the past as well. While in emergency department he appeared to be post eked though and then proceeded to have another tonic-clonic seizure witnessed by emergency department physician. At this point he received 2 mg of Ativan IM and was started on IV Keppra.  Review of Systems:  Unable to obtain patient is not answering questions   Past Medical History: Past Medical History  Diagnosis Date  . Seizure disorder 2001     EEG 07/27/10, alcohol abuse & med noncomplliance, began after traumatic subarachnoid hem  . Alcohol abuse   . CVA (cerebral infarction)   . 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 HTN (hypertension)   . GERD (gastroesophageal reflux disease)   . Pancreatitis chronic   . Lung nodule   . Esophageal ulcer without bleeding    History reviewed. No pertinent past surgical history.   Medications: Prior to Admission medications   Medication Sig Start Date End Date Taking? Authorizing Provider  amLODipine (NORVASC) 5 MG tablet Take 5 mg by mouth daily.      Historical Provider, MD  aspirin 81 MG tablet Take 81 mg by mouth daily.      Historical Provider, MD  donepezil (ARICEPT) 10 MG tablet Take 5 mg by mouth at bedtime.     Historical Provider, MD  ferrous sulfate 325 (65 FE) MG tablet Take 325 mg by mouth 2 (two) times daily.      Historical Provider, MD  folic acid (FOLVITE) 800 MCG tablet Take 400 mcg by mouth daily.      Historical  Provider, MD  levETIRAcetam (KEPPRA) 100 MG/ML solution Take 1,500 mg by mouth 2 (two) times daily.      Historical Provider, MD  lisinopril (PRINIVIL,ZESTRIL) 20 MG tablet Take 20 mg by mouth daily.      Historical Provider, MD  memantine (NAMENDA) 5 MG tablet Take 5 mg by mouth daily.     Historical Provider, MD  omeprazole (PRILOSEC) 40 MG capsule Take 40 mg by mouth 2 (two) times daily.      Historical Provider, MD  ondansetron (ZOFRAN-ODT) 4 MG disintegrating tablet Take 4 mg by mouth every 8 (eight) hours as needed. For vomiting     Historical Provider, MD  Pancrelipase, Lip-Prot-Amyl, (CREON) 6000 UNITS CPEP Take 1 each by mouth 3 (three) times daily.     Historical Provider, MD  phenytoin (DILANTIN) 100 MG ER capsule Take by mouth 3 (three) times daily.      Historical Provider, MD  thiamine 250 MG tablet Take 250 mg by mouth daily.      Historical Provider, MD    Allergies:  No Known Allergies  Social History:  reports that he has been smoking.  He does not have any smokeless tobacco history on file. He reports that he drinks alcohol. this history is per her records patient was not able to give me any history  Family History: family history includes Colon cancer in his  sister. per record   Physical Exam: Patient Vitals for the past 24 hrs:  BP Pulse Resp SpO2  02/23/11 0019 161/98 mmHg - 14  96 %  02/23/11 0002 145/98 mmHg 96  22  99 %      in No Acute distress, somnolent not following commands occasionally groaning Mucous Membranes and Skin: Somewhat dry mucous membranes decreased skin turgor Head Non traumatic neck appears supple Heart: Rapid but regular no murmurs appreciated Lungs: Clear to auscultation bilaterally the patient is not cooperative Abdomen: Soft appears nontender Lower extremities: No clubbing cyanosis or edema Neurologically Grossly intact:  Skin clean Dry and intact no rash:  body mass index is unknown because there is no height or weight on  file.   Labs on Admission:   [facility] 02/23/11 0029  NA 143  K 2.9*  CL 97  CO2 23  GLUCOSE 224*  BUN 11  CREATININE 0.67  CALCIUM 9.4  MG --  PHOS --    Basename 02/23/11 0029  AST 20  ALT 22  ALKPHOS 131*  BILITOT 0.3  PROT 6.7  ALBUMIN 3.4*    Basename 02/23/11 0029  LIPASE 26  AMYLASE --    Basename 02/23/11 0029  WBC 20.9*  NEUTROABS --  HGB 12.8*  HCT 40.2  MCV 85.0  PLT 350   No results found for this basename: CKTOTAL:3,CKMB:3,CKMBINDEX:3,TROPONINI:3 in the last 72 hours No results found for this basename: TSH,T4TOTAL,FREET3,T3FREE,THYROIDAB in the last 72 hours No results found for this basename: VITAMINB12:2,FOLATE:2,FERRITIN:2,TIBC:2,IRON:2,RETICCTPCT:2 in the last 72 hours Lab Results  Component Value Date   HGBA1C 6.5* 11/26/2010    CrCl is unknown because there is no height on file for the current visit. ABG    Component Value Date/Time   HCO3 28.8* 11/25/2010 2304   TCO2 30 11/25/2010 2304   O2SAT 98.0 11/25/2010 2304     No results found for this basename: DDIMER         Blood Culture    Component Value Date/Time   SDES URINE, RANDOM 01/17/2011 0830   SPECREQUEST IMMUNE:NORM UT SYMPT:NEG 01/17/2011 0830   CULT  Value: GROUP B STREP(S.AGALACTIAE)ISOLATED Note: TESTING AGAINST S. AGALACTIAE NOT ROUTINELY PERFORMED DUE TO PREDICTABILITY OF AMP/PEN/VAN SUSCEPTIBILITY. 01/17/2011 0830   REPTSTATUS 01/18/2011 FINAL 01/17/2011 0830       Radiological Exams on Admission: Ct Head Wo Contrast  02/23/2011  *RADIOLOGY REPORT*  Clinical Data: Altered mental status; grand mal seizure. Incontinence.  CT HEAD WITHOUT CONTRAST  Technique:  Contiguous axial images were obtained from the base of the skull through the vertex without contrast.  Comparison: CT of the head performed 11/19/2010  Findings: There is no evidence of acute infarction, mass lesion, or intra- or extra-axial hemorrhage on CT.  Evaluation is mildly suboptimal due to motion  artifact.  Prominence of the ventricles and sulci reflects mild cortical volume loss.  Scattered periventricular and subcortical white matter change likely reflects small vessel ischemic microangiopathy.  Chronic lacunar infarcts are seen within the basal ganglia bilaterally.  Mild cerebellar atrophy is noted.  The brainstem and fourth ventricle are within normal limits.  The cerebral hemispheres demonstrate grossly normal gray-white differentiation.  No mass effect or midline shift is seen.  There is no evidence of fracture; visualized osseous structures are unremarkable in appearance.  The orbits are within normal limits. There is mild partial opacification of the left ethmoid air cells. The paranasal sinuses and mastoid air cells are well-aerated.  No significant soft tissue abnormalities are seen.  IMPRESSION:  1.  No acute intracranial pathology seen. 2.  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 3.  Persistent mild partial opacification of the left ethmoid air cells.  Original Report Authenticated By: Tonia Ghent, M.D.    Assessment/Plan  Present on Admission:  .Seizure - he has a history of seizure disorder also have a history of alcohol withdrawal seizures. Has history of noncompliance. Will continue on Keppra and Dilantin. Will check his Dilantin level. Will put on seizure precautions. Would recommend neurology consult in the a.m.   most likely cause of the seizure is noncompliance  .Alcohol abuse - put on CIWA protocol watch and step down, give thiamine and folate, social work consult  .HTN (hypertension) - currently unable to take any by mouth meds once able continue his home meds  .Leukocytosis - possible stress reaction but will update UA and chest x-ray  .Hypokalemia - will replace and check magnesium level  .Hyperglycemia - . Elevated blood sugar we'll check hemoglobin A1c and put on  sliding scale. he does not carry diagnosis of diabetes Encephalopathy - likely post ictal Dementia - continue Aricept and Namenda once able to take by mouth  Prophylaxis:SCDs Protonix  CODE STATUS:Presumably full code   Caresse Sedivy 02/23/2011, 2:22 AM

## 2011-02-23 NOTE — Progress Notes (Signed)
Subjective: Sleeping. Only states "I'm cold" when I uncover him to examine him. He opens his eyes but does not follow commands or answer questions. Per nurse, his wife was in earlier and didn't say anything more than "I'm Cold" to her. She told the nurse that she gives him his meds but is not sure if he swallows them or just pockets them. She also states that he was at a SNF but was discharged. She feels he needs to go back because she cannot manage him at home. Also, he was supposed to have an outpt Ct for abd pain and weight loss. SAw Dr Marja Kays at [facility].   Objective: Patient Vitals for the past 24 hrs:  BP Temp Temp src Pulse Resp SpO2 Weight  02/23/11 1400 - - - 92  19  97 % -  02/23/11 1300 - - - 90  18  100 % -  02/23/11 1200 129/75 mmHg - - 86  18  98 % -  02/23/11 1159 - 99.7 F (37.6 C) Oral - - - -  02/23/11 1100 - - - 92  18  94 % -  02/23/11 1000 - - - 87  18  96 % -  02/23/11 0900 - - - 80  20  98 % -  02/23/11 0815 - 99.2 F (37.3 C) Oral - - - -  02/23/11 0800 132/84 mmHg - - 81  19  91 % -  02/23/11 0700 - - - 95  18  98 % -  02/23/11 0600 122/74 mmHg - - 87  19  96 % -  02/23/11 0500 152/87 mmHg - - 91  20  97 % -  02/23/11 0430 148/89 mmHg - - 85  18  98 % -  02/23/11 0400 136/84 mmHg - - 88  15  98 % -  02/23/11 0330 151/85 mmHg 97.9 F (36.6 C) Oral 80  18  98 % 49.9 kg (110 lb 0.2 oz)  02/23/11 0243 - 99.3 F (37.4 C) Rectal - - - -  02/23/11 0237 - - - - 20  97 % -  02/23/11 0233 132/84 mmHg - - 101  - - -  02/23/11 0019 161/98 mmHg - - - 14  96 % -  02/23/11 0002 145/98 mmHg - - 96  22  99 % -   Weight change:   Intake/Output Summary (Last 24 hours) at 02/23/11 1539 Last data filed at 02/23/11 1400  Gross per 24 hour  Intake    815 ml  Output      0 ml  Net    815 ml    Physical Exam: General appearance: Non cooperative. Awakens but does not answer questions or follow commands.  Lungs: clear to auscultation bilaterally Heart: regular rate and  rhythm Abdomen: soft, non-tender; bowel sounds normal; no masses,  no organomegaly Extremities: extremities normal, atraumatic, no cyanosis or edema  Lab Results:  [facility] 02/23/11 0510 02/23/11 0029  NA 140 143  K 2.2* 2.9*  CL 102 97  CO2 28 23  GLUCOSE 117* 224*  BUN 11 11  CREATININE 0.63 0.67  CALCIUM 8.3* 9.4  MG 1.7 --  PHOS 2.6 --    Basename 02/23/11 0510 02/23/11 0029  AST 16 20  ALT 19 22  ALKPHOS 123* 131*  BILITOT 0.3 0.3  PROT 6.0 6.7  ALBUMIN 3.1* 3.4*    Basename 02/23/11 0029  LIPASE 26  AMYLASE --    Basename 02/23/11 0510  02/23/11 0029  WBC 19.1* 20.9*  NEUTROABS -- --  HGB 11.8* 12.8*  HCT 36.5* 40.2  MCV 82.6 85.0  PLT 318 350   No results found for this basename: CKTOTAL:3,CKMB:3,CKMBINDEX:3,TROPONINI:3 in the last 72 hours No results found for this basename: POCBNP:3 in the last 72 hours No results found for this basename: DDIMER:2 in the last 72 hours  Basename 02/23/11 0510  HGBA1C 5.6   No results found for this basename: CHOL:2,HDL:2,LDLCALC:2,TRIG:2,CHOLHDL:2,LDLDIRECT:2 in the last 72 hours  Basename 02/23/11 0510  TSH 2.107  T4TOTAL --  T3FREE --  THYROIDAB --   No results found for this basename: VITAMINB12:2,FOLATE:2,FERRITIN:2,TIBC:2,IRON:2,RETICCTPCT:2 in the last 72 hours  Micro Results: Recent Results (from the past 240 hour(s))  MRSA PCR SCREENING     Status: Normal   Collection Time   02/23/11  3:32 AM      Component Value Range Status Comment   MRSA by PCR NEGATIVE  NEGATIVE  Final     Studies/Results: Dg Chest 2 View  02/23/2011  *RADIOLOGY REPORT*  Clinical Data: Shortness of breath.  CHEST - 2 VIEW  Comparison: Chest radiograph performed 11/19/2010  Findings: The lungs are well-aerated.  Minimal bibasilar opacities are similar to prior studies and likely reflect atelectasis.  There is no evidence of pleural effusion or pneumothorax.  The heart is borderline normal in size; the mediastinal contour is  within normal limits.  No acute osseous abnormalities are seen.  IMPRESSION: Minimal bibasilar opacities likely reflect atelectasis.  Original Report Authenticated By: Tonia Ghent, M.D.   Ct Head Wo Contrast  02/23/2011  *RADIOLOGY REPORT*  Clinical Data: Altered mental status; grand mal seizure. Incontinence.  CT HEAD WITHOUT CONTRAST  Technique:  Contiguous axial images were obtained from the base of the skull through the vertex without contrast.  Comparison: CT of the head performed 11/19/2010  Findings: There is no evidence of acute infarction, mass lesion, or intra- or extra-axial hemorrhage on CT.  Evaluation is mildly suboptimal due to motion artifact.  Prominence of the ventricles and sulci reflects mild cortical volume loss.  Scattered periventricular and subcortical white matter change likely reflects small vessel ischemic microangiopathy.  Chronic lacunar infarcts are seen within the basal ganglia bilaterally.  Mild cerebellar atrophy is noted.  The brainstem and fourth ventricle are within normal limits.  The cerebral hemispheres demonstrate grossly normal gray-white differentiation.  No mass effect or midline shift is seen.  There is no evidence of fracture; visualized osseous structures are unremarkable in appearance.  The orbits are within normal limits. There is mild partial opacification of the left ethmoid air cells. The paranasal sinuses and mastoid air cells are well-aerated.  No significant soft tissue abnormalities are seen.  IMPRESSION:  1.  No acute intracranial pathology seen. 2.  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 3.  Persistent mild partial opacification of the left ethmoid air cells.  Original Report Authenticated By: Tonia Ghent, M.D.    Medications: Scheduled Meds:   . insulin aspart  0-9 Units Subcutaneous Q4H  . levetiracetam  1,500 mg Intravenous To Major  .  levetiracetam  1,500 mg Intravenous Q12H  . LORazepam  0-4 mg Intravenous Q6H   Followed by  . LORazepam  0-4 mg Intravenous Q12H  . LORazepam  2 mg Intramuscular Once  . ondansetron      . potassium chloride  10 mEq Intravenous Q1 Hr x 3  . general admission iv  infusion   Intravenous Once  . DISCONTD: 0.9 % NaCl with KCl 20 mEq / L   Intravenous To Major  . DISCONTD: amLODipine  5 mg Oral Daily  . DISCONTD: donepezil  5 mg Oral QHS  . DISCONTD: folic acid  1 mg Oral To Major  . DISCONTD: lipase/protease/amylase  1 capsule Oral TID WC  . DISCONTD: LORazepam  1 mg Intravenous Once  . DISCONTD: memantine  5 mg Oral Daily  . DISCONTD: multivitamins ther. w/minerals  1 tablet Oral To Major  . DISCONTD: pantoprazole (PROTONIX) IV  40 mg Intravenous QHS  . DISCONTD: phenytoin  100 mg Oral TID  . DISCONTD: thiamine  100 mg Intravenous To Major  . DISCONTD: thiamine  100 mg Oral To Major  . DISCONTD: thiamine  250 mg Oral Daily   Continuous Infusions:  PRN Meds:.albuterol, hydrALAZINE, LORazepam, ondansetron (ZOFRAN) IV, ondansetron, senna-docusate, DISCONTD: alum & mag hydroxide-simeth, DISCONTD: guaiFENesin-dextromethorphan, DISCONTD: LORazepam  Assessment/Plan: Principal Problem:  *Seizure- Dilantin level low. On IV Keppra right now. Still looks postictal. I have consulted Neurology to advise about meds. If he is truly pocketing, then we may need to try liquid however, Dilantin in liquid form results in very unpredictable levels. Neuro to further advise.  Post-Ictal state- NPO Dementia- On Aricept and Namenda Alcohol abuse- not sure if he is still drinking. Per wife, he leaves to house for hours and she is not sure of what he does during that time. CIWA score 0.  HTN (hypertension)- BP normal- hold PO meds as he is still not alert.  Leukocytosis- No UA done. CXR- possible b/l atelectasis. Will send UA- monitor for diarrhea. REcheck WBC count in AM. Has low grade fevers.  Hypokalemia-  Given K+ this AM- Will recheck now.  Hyperglycemia- Sugars not high on Accuchecks today- A1c normal. Will d/c accuchecks.  Chronic Pancreatitis   May need to do Ct abd/pelvis while he is here for the abdominal pain he has been having.    I have discontinued all PO meds as he is poorly responsive. He will need SNF placement.   LOS: 1 day   [facility] 02/23/2011, 3:39 PM

## 2011-02-23 NOTE — Progress Notes (Signed)
Critical Value Alert  K+- 2.2  MD notified- Dr. Esmeralda Arthur  Will receive orders for IV K+ replacement  Will continue to monitor

## 2011-02-23 NOTE — ED Notes (Signed)
RN at pt's bedside attempting IV start/lab draw when pt, whom was still post-ictal, began to seize. Pt noted to have extended left leg up with arm & facial jerking/twitching motions--also making grunting noises. Pt able to maintain airway on RA. Bladder incontinence noted. Drooling. Pt placed on left side. Seizure activity noted to last appx 1 full minute. Dr. Patria Mane aware and at bedside assessing patient.

## 2011-02-23 NOTE — Progress Notes (Signed)
Utilization Review Completed.[name] [name] T11/29/2012   

## 2011-02-23 NOTE — ED Provider Notes (Addendum)
History     CSN: [id number] Arrival date & time: 02/22/2011 11:16 PM   First MD Initiated Contact with Patient 02/22/11 2325      Chief Complaint  Patient presents with  . Seizures    (Consider location/radiation/quality/duration/timing/severity/associated sxs/prior treatment) The history is provided by medical records. The history is limited by the condition of the patient.   EMS reports seizure at home.  EMS found the patient postictal with incontinence.  At time arrival to the emergency department is reported the patient was speaking however on my valuation the patient was nonresponsive and appeared to have tonic-clonic seizure.  The nurses report this is that are going on for approximately 45 seconds on my arrival to the patient.  The patient received an IM dose of Ativan.  Review the records demonstrate that the patient is on Keppra.  This information is received from his [facility] neurology summary [facility] on 12/30/2010.  Our prior records demonstrate that the patient's on Dilantin, however for the most recent records it appears as though he is not.  The chart also reports a history of alcohol abuse alcohol withdrawal seizures and history of pancreatitis.  There is no family available for additional history at this time and EMS had left the patient at the time of my evaluation  Past Medical History  Diagnosis Date  . Seizure disorder 2001     EEG 07/27/10, alcohol abuse & med noncomplliance, began after traumatic subarachnoid hem  . Alcohol abuse   . CVA (cerebral infarction)   . 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 HTN (hypertension)   . GERD (gastroesophageal reflux disease)   . Pancreatitis chronic     History reviewed. No pertinent past surgical history.  Family History  Problem Relation Age of Onset  . Colon cancer Sister     History  Substance Use Topics  . Smoking status: Smoker, Current Status Unknown  . Smokeless tobacco: Not on file  . Alcohol  Use: Yes      Review of Systems  All other systems reviewed and are negative.    Allergies  Review of patient's allergies indicates no known allergies.  Home Medications   Current Outpatient Rx  Name Route Sig Dispense Refill  . AMLODIPINE BESYLATE 5 MG PO TABS Oral Take 5 mg by mouth daily.      . ASPIRIN 81 MG PO TABS Oral Take 81 mg by mouth daily.      . DONEPEZIL HCL 10 MG PO TABS Oral Take 5 mg by mouth at bedtime.     Marland Kitchen FERROUS SULFATE 325 (65 FE) MG PO TABS Oral Take 325 mg by mouth 2 (two) times daily.      Marland Kitchen FOLIC ACID 800 MCG PO TABS Oral Take 400 mcg by mouth daily.      Marland Kitchen LEVETIRACETAM 100 MG/ML PO SOLN Oral Take 1,500 mg by mouth 2 (two) times daily.      Marland Kitchen LISINOPRIL 20 MG PO TABS Oral Take 20 mg by mouth daily.      Marland Kitchen MEMANTINE HCL 5 MG PO TABS Oral Take 5 mg by mouth daily.     Marland Kitchen OMEPRAZOLE 40 MG PO CPDR Oral Take 40 mg by mouth 2 (two) times daily.      Marland Kitchen ONDANSETRON 4 MG PO TBDP Oral Take 4 mg by mouth every 8 (eight) hours as needed. For vomiting     . PANCRELIPASE (LIP-PROT-AMYL) 6000 UNITS PO CPEP Oral Take 1  each by mouth 3 (three) times daily.     Marland Kitchen PHENYTOIN SODIUM EXTENDED 100 MG PO CAPS Oral Take by mouth 3 (three) times daily.      . THIAMINE HCL 250 MG PO TABS Oral Take 250 mg by mouth daily.        BP 145/98  Pulse 96  Resp 22  SpO2 99%  Physical Exam  Nursing note and vitals reviewed. Constitutional: He appears well-developed and well-nourished.  HENT:  Head: Normocephalic and atraumatic.  Eyes: EOM are normal.  Neck: Neck supple.  Cardiovascular: Normal rate, regular rhythm, normal heart sounds and intact distal pulses.   Pulmonary/Chest: Breath sounds normal.  Abdominal: Soft. He exhibits no distension.  Musculoskeletal: Normal range of motion.  Neurological:       Unresponsive.  Seizure activity.  No response to pain.  Seizure activity appears to be bilateral tonic-clonic activity  Skin: Skin is warm and dry.    ED Course    Procedures (including critical care time)   Labs Reviewed  CBC  COMPREHENSIVE METABOLIC PANEL  ETHANOL  POCT CBG MONITORING  LIPASE, BLOOD   Ct Head Wo Contrast  02/23/2011  *RADIOLOGY REPORT*  Clinical Data: Altered mental status; grand mal seizure. Incontinence.  CT HEAD WITHOUT CONTRAST  Technique:  Contiguous axial images were obtained from the base of the skull through the vertex without contrast.  Comparison: CT of the head performed 11/19/2010  Findings: There is no evidence of acute infarction, mass lesion, or intra- or extra-axial hemorrhage on CT.  Evaluation is mildly suboptimal due to motion artifact.  Prominence of the ventricles and sulci reflects mild cortical volume loss.  Scattered periventricular and subcortical white matter change likely reflects small vessel ischemic microangiopathy.  Chronic lacunar infarcts are seen within the basal ganglia bilaterally.  Mild cerebellar atrophy is noted.  The brainstem and fourth ventricle are within normal limits.  The cerebral hemispheres demonstrate grossly normal gray-white differentiation.  No mass effect or midline shift is seen.  There is no evidence of fracture; visualized osseous structures are unremarkable in appearance.  The orbits are within normal limits. There is mild partial opacification of the left ethmoid air cells. The paranasal sinuses and mastoid air cells are well-aerated.  No significant soft tissue abnormalities are seen.  IMPRESSION:  1.  No acute intracranial pathology seen. 2.  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 3.  Persistent mild partial opacification of the left ethmoid air cells.  Original Report Authenticated By: Tonia Ghent, M.D.     1. Seizure   2. Altered mental status   3. Hypokalemia       MDM  Ganglia this is alcohol withdrawal seizure versus noncompliance with antiepileptic drugs versus  breakthrough seizure.  2 mg IM Ativan given in the emergency department.  IV access being obtained at this time.  Although the patient with 1.5 g a Keppra.  Her CBG is being obtained at this time.  Patient will get labs including a lipase given his history of pancreatitis.  CT head pending at this time.  He'll continue to monitor this patient closely.  Hopefully family will present n.p.o. provide additional information regarding the last several days for this patient.  He does not smell of alcohol this time and therefore my concern is possible alcohol withdrawal seizures   1:49 AM Spoke with triad who will admit the patient. K repleated with IV K. Still post ictal  at this time. Protecting his airway       Lyanne Co, MD 02/23/11 0149  Lyanne Co, MD 02/23/11 [phone]

## 2011-02-23 NOTE — ED Notes (Signed)
Transferred pt to RM#9 from PTO#9. Gave report to RN @ bedside.

## 2011-02-23 NOTE — Progress Notes (Signed)
   CARE MANAGEMENT NOTE 02/23/2011  Patient:  [name] [name], [name] [name]   Account Number:  [id number]  Date Initiated:  02/23/2011  Documentation initiated by:  Onnie Boer  Subjective/Objective Assessment:   PT WAS ADMITTED WITH AMS AND SEIZURES     Action/Plan:   PROGRESSION OF CARE AND DISCHARGE PLANNING   Anticipated DC Date:  02/28/2011   Anticipated DC Plan:  SKILLED NURSING FACILITY  In-house referral  Clinical Social Worker      DC Planning Services  CM consult      Choice offered to / List presented to:             Status of service:  In process, will continue to follow Medicare Important Message given?   (If response is "NO", the following Medicare IM given date fields will be blank) Date Medicare IM given:   Date Additional Medicare IM given:    Discharge Disposition:    Per UR Regulation:  Reviewed for med. necessity/level of care/duration of stay  Comments:  02/23/2011 Onnie Boer, RN, BSN  1548 PT WAS ADMITTED WITH AMS AND SEIZURES.   PTA PT'S WIFE [name] [name] STATED THAT THE PT WAS VERY DEMENTED LEAVING THE HOUSE STAYING GONE ALL DAY, NOT TAKING MEDS, NOT HAVING PERSONAL HYGIENE, REFUSING HELP FROM FAMILY FOR CARE.  PT'S WIFE STATES THAT SHE FOUND HIM IN FECAL CONTENT AND WHEN HELPING HIM CLEAN UP HE STARTED HAVING A SEIZURE, SHE ALSO STATED THAT HE SLEEPS ALOT AFTER HE TAKES HIS MEDS. PT WAS JUST DC'D FROM [facility] IN [place] FOR SHORT TERM REHAB.  PT WAS SEEING DR PONDER IN [place] FOR STOMACH PAINS AND WAS TO HAVE A CT ON THIS FRIDAY AT THE CLINIC. MD/ CSW MADE AWARE OF ALL OF THE ABOVE, WILL F/U ON  DC NEEDS

## 2011-02-23 NOTE — Progress Notes (Signed)
CRITICAL VALUE ALERT  Critical value received:  Potassium 2.7  Date of notification:  02/23/2011  Time of notification:  1823  Critical value read back:Yes  Nurse who received alert:  Donny Pique, RN  MD notified (1st page):  Dr. Butler Denmark  Time of first page:  1823  MD notified (2nd page):  Time of second page:  Responding MD:  Dr. Butler Denmark  Time MD responded:  [phone]

## 2011-02-24 LAB — GLUCOSE, CAPILLARY
Glucose-Capillary: 108 mg/dL — ABNORMAL HIGH (ref 70–99)
Glucose-Capillary: 121 mg/dL — ABNORMAL HIGH (ref 70–99)
Glucose-Capillary: 131 mg/dL — ABNORMAL HIGH (ref 70–99)
Glucose-Capillary: 143 mg/dL — ABNORMAL HIGH (ref 70–99)
Glucose-Capillary: 97 mg/dL (ref 70–99)

## 2011-02-24 LAB — CBC
Hemoglobin: 11.3 g/dL — ABNORMAL LOW (ref 13.0–17.0)
MCH: 26.9 pg (ref 26.0–34.0)
MCHC: 32.2 g/dL (ref 30.0–36.0)
RDW: 15.7 % — ABNORMAL HIGH (ref 11.5–15.5)

## 2011-02-24 LAB — BASIC METABOLIC PANEL
Calcium: 8.4 mg/dL (ref 8.4–10.5)
GFR calc Af Amer: >90 mL/min (ref >90)
GFR calc non Af Amer: >90 mL/min (ref >90)
Glucose, Bld: 110 mg/dL — ABNORMAL HIGH (ref 70–99)
Potassium: 3.1 mEq/L — ABNORMAL LOW (ref 3.5–5.1)
Sodium: 142 mEq/L (ref 135–145)

## 2011-02-24 LAB — PHENYTOIN LEVEL, TOTAL: Phenytoin Lvl: 17.3 ug/mL (ref 10.0–20.0)

## 2011-02-24 MED ORDER — POTASSIUM CHLORIDE IN NACL 40-0.9 MEQ/L-% IV SOLN
INTRAVENOUS | Status: DC
  Administered 2011-02-24: 13:00:00 via INTRAVENOUS
  Administered 2011-02-25: 75 mL/h via INTRAVENOUS
  Administered 2011-02-25: 20 mL/h via INTRAVENOUS
  Filled 2011-02-24 (×4): qty 1000

## 2011-02-24 MED ORDER — POTASSIUM CHLORIDE 10 MEQ/100ML IV SOLN
10.0000 meq | INTRAVENOUS | Status: AC
  Administered 2011-02-24 (×2): 10 meq via INTRAVENOUS
  Filled 2011-02-24 (×2): qty 100

## 2011-02-24 NOTE — Progress Notes (Signed)
Pt d/c his new IV site that was placed and is refusing to get his labs drawn this morning. I will try to persuade him once more that we are doing theses things to better his care. Will continue to monitor

## 2011-02-24 NOTE — Progress Notes (Signed)
Clinical social worker unable to complete pt psychosocial assessment. CSW left message for pt wife who is requested skilled nursing. CSW continuing to follow to assist with pt dc plans.   Catha Gosselin, Theresia Majors  [phone] .02/24/2011 16:52pm

## 2011-02-24 NOTE — Progress Notes (Signed)
Subjective: I have reviewed the patient's admission history, database, and consult notes in detail. The patient is now becoming more awake and is also displaying significant agitation as per the nurse.  At the time of my evaluation he is alert but is quite sluggish. He can follow simple commands. He denies specific complaints but says that he "just didn't feel good".  Objective: Weight change:   Intake/Output Summary (Last 24 hours) at 02/24/11 1527 Last data filed at 02/24/11 1300  Gross per 24 hour  Intake 2322.33 ml  Output    510 ml  Net 1812.33 ml   Blood pressure 112/85, pulse 84, temperature 98.1 F (36.7 C), temperature source Oral, resp. rate 18, weight 49.9 kg (110 lb 0.2 oz), SpO2 100.00%. Temp:  [98.1 F (36.7 C)-99.8 F (37.7 C)] 98.1 F (36.7 C) (11/30 1138) Pulse Rate:  [82-92] 84  (11/30 0400) Resp:  [14-20] 18  (11/30 1138) BP: (109-136)/(75-87) 112/85 mmHg (11/30 0816) SpO2:  [94 %-100 %] 100 % (11/30 1138)  Physical Exam: General: No acute respiratory distress but sluggish/lethargic Lungs: Clear to auscultation bilaterally without wheezes or crackles Cardiovascular: Regular rate and rhythm without murmur gallop or rub normal S1 and S2 Abdomen: Nontender, nondistended, soft, bowel sounds positive, no rebound, no ascites, no appreciable mass Extremities: No significant cyanosis, clubbing, or edema bilateral lower extremities  Lab Results:  Basename 02/24/11 0518 02/23/11 1718 02/23/11 0510 02/23/11 0029  NA 142 -- 140 143  K 3.1* 2.7* 2.2* --  CL 107 -- 102 97  CO2 25 -- 28 23  GLUCOSE 110* -- 117* 224*  BUN 10 -- 11 11  CREATININE 0.63 -- 0.63 0.67  CALCIUM 8.4 -- 8.3* 9.4  MG -- -- 1.7 --  PHOS -- -- 2.6 --    Basename 02/24/11 0518 02/23/11 0510 02/23/11 0029  AST -- 16 20  ALT -- 19 22  ALKPHOS -- 123* 131*  BILITOT -- 0.3 0.3  PROT -- 6.0 6.7  ALBUMIN 2.6* 3.1* --    Basename 02/23/11 0029  LIPASE 26  AMYLASE --    Basename 02/24/11  0518 02/23/11 0510 02/23/11 0029  WBC 13.5* 19.1* 20.9*  NEUTROABS -- -- --  HGB 11.3* 11.8* 12.8*  HCT 35.1* 36.5* 40.2  MCV 83.6 82.6 85.0  PLT 259 318 350   No results found for this basename: CKTOTAL:3,CKMB:3,CKMBINDEX:3,TROPONINI:3 in the last 72 hours No results found for this basename: POCBNP:3 in the last 72 hours No results found for this basename: DDIMER:2 in the last 72 hours  Basename 02/23/11 0510  HGBA1C 5.6   No results found for this basename: CHOL:2,HDL:2,LDLCALC:2,TRIG:2,CHOLHDL:2,LDLDIRECT:2 in the last 72 hours  Basename 02/23/11 0510  TSH 2.107  T4TOTAL --  T3FREE --  THYROIDAB --   No results found for this basename: VITAMINB12:2,FOLATE:2,FERRITIN:2,TIBC:2,IRON:2,RETICCTPCT:2 in the last 72 hours  Micro Results: Recent Results (from the past 240 hour(s))  MRSA PCR SCREENING     Status: Normal   Collection Time   02/23/11  3:32 AM      Component Value Range Status Comment   MRSA by PCR NEGATIVE  NEGATIVE  Final     Studies/Results: Scheduled Meds:   . dextrose  25 mL Intravenous Once  . insulin aspart  0-9 Units Subcutaneous Q4H  . levetiracetam  1,500 mg Intravenous Q12H  . LORazepam  0-4 mg Intravenous Q6H   Followed by  . LORazepam  0-4 mg Intravenous Q12H  . phenytoin (DILANTIN) IV  1,000 mg Intravenous NOW  .  phenytoin (DILANTIN) IV  100 mg Intravenous Q8H  . potassium chloride  10 mEq Intravenous Q1 Hr x 4  . general admission iv infusion   Intravenous Once   Continuous Infusions:   . 0.9 % NaCl with KCl 40 mEq / L 75 mL/hr at 02/24/11 1259  . DISCONTD: 0.9 % NaCl with KCl 20 mEq / L Stopped (02/24/11 1259)   PRN Meds:.hydrALAZINE, LORazepam, ondansetron (ZOFRAN) IV, ondansetron, senna-docusate, DISCONTD: albuterol  Assessment/Plan:  Seizure- history of longstanding complex partial seizure disorder with secondary  Generalization-Dilantin level low at presentation. On IV Keppra right now. Still looks postictal, but is beginning to  clear mentally. Neuro has seen him.  We are following their reccs.  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 Per wife, he leaves to house for hours and she is not sure of what he does during that time. We will continue the Ativan withdrawal protocol and monitor closely.  HTN (hypertension)- BP normal- hold PO meds as he is still not alert.  His current well-controlled blood pressure is likely related to hypovolemia.  Leukocytosis- likely due to margination related to the patient's seizure-improving consistently-no other signs or symptoms to suggest active infection   Hypokalemia- persistent-continue replacement-recheck magnesium in a.m. as it was borderline  Hyperglycemia- resolved- A1c normal  Chronic Pancreatitis    LOS: 2 days   Byanca Kasper T 02/24/2011, 3:27 PM

## 2011-02-24 NOTE — Progress Notes (Signed)
I was able to convince pt have his blood work done and was able to reinforce his remaining IV site. Prn ativan given as ordered per CIWA protocol, will continue to monitor

## 2011-02-25 LAB — GLUCOSE, CAPILLARY

## 2011-02-25 LAB — BASIC METABOLIC PANEL
CO2: 22 mEq/L (ref 19–32)
Calcium: 8.3 mg/dL — ABNORMAL LOW (ref 8.4–10.5)
Creatinine, Ser: 0.6 mg/dL (ref 0.50–1.35)
GFR calc Af Amer: >90 mL/min (ref >90)
Sodium: 138 mEq/L (ref 135–145)

## 2011-02-25 LAB — CBC
Platelets: 229 10*3/uL (ref 150–400)
RBC: 4.18 MIL/uL — ABNORMAL LOW (ref 4.22–5.81)
RDW: 15.9 % — ABNORMAL HIGH (ref 11.5–15.5)
WBC: 9.2 10*3/uL (ref 4.0–10.5)

## 2011-02-25 LAB — PHENYTOIN LEVEL, TOTAL: Phenytoin Lvl: 16.9 ug/mL (ref 10.0–20.0)

## 2011-02-25 MED ORDER — VITAMIN B-1 100 MG PO TABS
100.0000 mg | ORAL_TABLET | Freq: Every day | ORAL | Status: DC
  Administered 2011-02-25 – 2011-02-28 (×4): 100 mg via ORAL
  Filled 2011-02-25 (×5): qty 1

## 2011-02-25 MED ORDER — PHENYTOIN 50 MG PO CHEW
300.0000 mg | CHEWABLE_TABLET | Freq: Every day | ORAL | Status: DC
  Filled 2011-02-25: qty 6

## 2011-02-25 MED ORDER — LEVETIRACETAM 100 MG/ML PO SOLN
1500.0000 mg | Freq: Two times a day (BID) | ORAL | Status: DC
  Administered 2011-02-25 – 2011-02-28 (×7): 1500 mg via ORAL
  Filled 2011-02-25 (×9): qty 15

## 2011-02-25 MED ORDER — FOLIC ACID 1 MG PO TABS
1.0000 mg | ORAL_TABLET | Freq: Every day | ORAL | Status: DC
Start: 2011-02-25 — End: 2011-02-28
  Administered 2011-02-25 – 2011-02-28 (×4): 1 mg via ORAL
  Filled 2011-02-25 (×5): qty 1

## 2011-02-25 MED ORDER — ACETAMINOPHEN 325 MG PO TABS
650.0000 mg | ORAL_TABLET | ORAL | Status: DC | PRN
  Administered 2011-02-26 – 2011-02-28 (×11): 650 mg via ORAL
  Filled 2011-02-25 (×11): qty 2

## 2011-02-25 MED ORDER — PANCRELIPASE (LIP-PROT-AMYL) 12000-38000 UNITS PO CPEP
1.0000 | ORAL_CAPSULE | Freq: Three times a day (TID) | ORAL | Status: DC
  Administered 2011-02-25 – 2011-02-28 (×9): 1 via ORAL
  Filled 2011-02-25 (×13): qty 1

## 2011-02-25 MED ORDER — PHENYTOIN SODIUM EXTENDED 100 MG PO CAPS
300.0000 mg | ORAL_CAPSULE | Freq: Every day | ORAL | Status: DC
  Administered 2011-02-25 – 2011-02-27 (×3): 300 mg via ORAL
  Filled 2011-02-25 (×4): qty 3

## 2011-02-25 NOTE — Progress Notes (Signed)
Subjective: The patient has made significant improvements overnight. He is much more alert and interactive. He can tell me that he is in the hospital though he cannot name the facility. He cannot tell me what year it is and he does not remember the specific events leading to his hospital stay. He complains primarily of epigastric discomfort which he admits is a chronic issue for him. He also states he feels "severely weak" and wants to be stronger.  Objective: Weight change:   Intake/Output Summary (Last 24 hours) at 02/25/11 1136 Last data filed at 02/25/11 0900  Gross per 24 hour  Intake 2004.58 ml  Output    250 ml  Net 1754.58 ml   Blood pressure 112/71, pulse 82, temperature 98.2 F (36.8 C), temperature source Oral, resp. rate 18, weight 52.9 kg (116 lb 10 oz), SpO2 99.00%. Temp:  [97.6 F (36.4 C)-98.5 F (36.9 C)] 98.2 F (36.8 C) (12/01 0823) Pulse Rate:  [74-88] 82  (12/01 0900) Resp:  [15-21] 18  (12/01 0900) BP: (102-124)/(66-78) 112/71 mmHg (12/01 0800) SpO2:  [99 %-100 %] 99 % (12/01 0900) Weight:  [52.9 kg (116 lb 10 oz)] 116 lb 10 oz (52.9 kg) (12/01 0500)  Physical Exam: General: No acute respiratory distress-malnourished/cachectic Lungs: Clear to auscultation bilaterally without wheezes or crackles Cardiovascular: Regular rate and rhythm without murmur gallop or rub normal S1 and S2 Abdomen: Nontender, nondistended, soft, bowel sounds positive, no rebound, no ascites, no appreciable mass Extremities: No significant cyanosis, clubbing, or edema bilateral lower extremities  Lab Results:  [facility] 02/25/11 0700 02/24/11 0518 02/23/11 1718 02/23/11 0510  NA 138 142 -- 140  K 4.2 3.1* 2.7* --  CL 110 107 -- 102  CO2 22 25 -- 28  GLUCOSE 103* 110* -- 117*  BUN 9 10 -- 11  CREATININE 0.60 0.63 -- 0.63  CALCIUM 8.3* 8.4 -- 8.3*  MG -- -- -- 1.7  PHOS -- -- -- 2.6    Basename 02/24/11 0518 02/23/11 0510 02/23/11 0029  AST -- 16 20  ALT -- 19 22  ALKPHOS --  123* 131*  BILITOT -- 0.3 0.3  PROT -- 6.0 6.7  ALBUMIN 2.6* 3.1* --    Basename 02/23/11 0029  LIPASE 26  AMYLASE --    Basename 02/25/11 0700 02/24/11 0518 02/23/11 0510  WBC 9.2 13.5* 19.1*  NEUTROABS -- -- --  HGB 11.3* 11.3* 11.8*  HCT 35.6* 35.1* 36.5*  MCV 85.2 83.6 82.6  PLT 229 259 318   No results found for this basename: CKTOTAL:3,CKMB:3,CKMBINDEX:3,TROPONINI:3 in the last 72 hours No results found for this basename: POCBNP:3 in the last 72 hours No results found for this basename: DDIMER:2 in the last 72 hours  Basename 02/23/11 0510  HGBA1C 5.6   No results found for this basename: CHOL:2,HDL:2,LDLCALC:2,TRIG:2,CHOLHDL:2,LDLDIRECT:2 in the last 72 hours  Basename 02/23/11 0510  TSH 2.107  T4TOTAL --  T3FREE --  THYROIDAB --   No results found for this basename: VITAMINB12:2,FOLATE:2,FERRITIN:2,TIBC:2,IRON:2,RETICCTPCT:2 in the last 72 hours  Micro Results: Recent Results (from the past 240 hour(s))  MRSA PCR SCREENING     Status: Normal   Collection Time   02/23/11  3:32 AM      Component Value Range Status Comment   MRSA by PCR NEGATIVE  NEGATIVE  Final     Studies/Results: Scheduled Meds:    . levetiracetam  1,500 mg Intravenous Q12H  . LORazepam  0-4 mg Intravenous Q6H   Followed by  . LORazepam  0-4 mg Intravenous Q12H  . phenytoin (DILANTIN) IV  100 mg Intravenous Q8H  . potassium chloride  10 mEq Intravenous Q1 Hr x 2  . DISCONTD: insulin aspart  0-9 Units Subcutaneous Q4H   Continuous Infusions:    . 0.9 % NaCl with KCl 40 mEq / L 75 mL/hr (02/25/11 0955)   PRN Meds:.hydrALAZINE, LORazepam, ondansetron (ZOFRAN) IV, ondansetron, senna-docusate  Assessment/Plan:  Seizure- history of longstanding complex partial seizure disorder with secondary  generalization-Dilantin level low at presentation. On IV Keppra right now. Still looks postictal, but is beginning to clear mentally. Neuro has seen him.  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 Per wife, he leaves the house for hours and she is not sure of what he does during that time. We will continue the Ativan withdrawal protocol and monitor closely.  HTN (hypertension)- the patient's blood pressure is currently well-controlled without medical therapy-we will continue to monitor  Leukocytosis- likely due to demargination related to the patient's seizure-improving consistently-no other signs or symptoms to suggest active infection   Hypokalemia- resolved with replacement  Hyperglycemia- resolved- A1c normal  Chronic Pancreatitis -resume Creon now that diet resumed   LOS: 3 days   Dwyne Hasegawa T 02/25/2011, 11:36 AM

## 2011-02-25 NOTE — Progress Notes (Signed)
CSW assessment completed.  CSW requested RN speak with MD re: PT/OT consult. Milus Banister MSW,LCSW w/e Coverage [phone]

## 2011-02-26 MED ORDER — LORAZEPAM 2 MG/ML IJ SOLN: 1.0000 mg | Freq: Four times a day (QID) | INTRAMUSCULAR | Status: DC | PRN

## 2011-02-26 NOTE — Progress Notes (Signed)
Subjective: Chart reviewed. Patient interviewed and examined. He says he lives in [place]. He cannot recollect the name of his PCP. He says he has had seizures for a long time but cannot recollect the exact duration. Claims compliance to antiepileptic medications. Denies alcohol abuse and says he drinks beer socially. He lives with his wife and apparently is independent of activities of daily living.  Currently denies any complaints. No pain or dyspnea or seizures. Per nursing patient has not required Ativan as part of the CIWA  Objective: Blood pressure 132/86, pulse 84, temperature 98.8 F (37.1 C), temperature source Oral, resp. rate 16, weight 52.9 kg (116 lb 10 oz), SpO2 94.00%. No intake or output data in the 24 hours ending 02/26/11 1716  General exam: Chronically ill looking cachectic male patient who is in no obvious distress. Preparing peaches dinner. Respiratory system: Clear. No increased work of breathing. Cardiovascular system: S1 and S2 heard, regular. No JVD. Gastrointestinal system: Abdomen is nondistended, soft and normal bowel sounds heard. Central nervous system: Patient is awake alert and oriented to person and place but not to time. He says that he does not keep a track of time. Extremities: Grade 5 x 5 power.   Lab Results: Basic Metabolic Panel:  Basename 02/25/11 0700 02/24/11 0518  NA 138 142  K 4.2 3.1*  CL 110 107  CO2 22 25  GLUCOSE 103* 110*  BUN 9 10  CREATININE 0.60 0.63  CALCIUM 8.3* 8.4  MG -- --  PHOS -- --   Liver Function Tests:  Basename 02/24/11 0518  AST --  ALT --  ALKPHOS --  BILITOT --  PROT --  ALBUMIN 2.6*   No results found for this basename: LIPASE:2,AMYLASE:2 in the last 72 hours No results found for this basename: AMMONIA:2 in the last 72 hours CBC:  Basename 02/25/11 0700 02/24/11 0518  WBC 9.2 13.5*  NEUTROABS -- --  HGB 11.3* 11.3*  HCT 35.6* 35.1*  MCV 85.2 83.6  PLT 229 259   Cardiac  Enzymes: No results found for this basename: CKTOTAL:3,CKMB:3,CKMBINDEX:3,TROPONINI:3 in the last 72 hours BNP: No results found for this basename: POCBNP:3 in the last 72 hours D-Dimer: No results found for this basename: DDIMER:2 in the last 72 hours CBG:  Basename 02/25/11 1234 02/25/11 0824 02/25/11 0339 02/24/11 2359 02/24/11 1945 02/24/11 1628  GLUCAP 139* 90 92 93 131* 143*   Hemoglobin A1C: No results found for this basename: HGBA1C in the last 72 hours Fasting Lipid Panel: No results found for this basename: CHOL,HDL,LDLCALC,TRIG,CHOLHDL,LDLDIRECT in the last 72 hours Thyroid Function Tests: No results found for this basename: TSH,T4TOTAL,FREET4,T3FREE,THYROIDAB in the last 72 hours Anemia Panel: No results found for this basename: VITAMINB12,FOLATE,FERRITIN,TIBC,IRON,RETICCTPCT in the last 72 hours Coagulation: No results found for this basename: LABPROT:2,INR:2 in the last 72 hours Urine Drug Screen: Drugs of Abuse     Component Value Date/Time   LABOPIA NONE DETECTED 01/15/2011 2143   COCAINSCRNUR NONE DETECTED 01/15/2011 2143   LABBENZ NONE DETECTED 01/15/2011 2143   AMPHETMU NONE DETECTED 01/15/2011 2143   THCU NONE DETECTED 01/15/2011 2143   LABBARB NONE DETECTED 01/15/2011 2143    Alcohol Level: No results found for this basename: ETH:2 in the last 72 hours Urinalysis:  Misc. Labs:   Micro Results: Recent Results (from the past 240 hour(s))  MRSA PCR SCREENING     Status: Normal   Collection Time   02/23/11  3:32 AM      Component Value Range  Status Comment   MRSA by PCR NEGATIVE  NEGATIVE  Final     Studies/Results: Dg Chest 2 View  02/23/2011  *RADIOLOGY REPORT*  Clinical Data: Shortness of breath.  CHEST - 2 VIEW  Comparison: Chest radiograph performed 11/19/2010  Findings: The lungs are well-aerated.  Minimal bibasilar opacities are similar to prior studies and likely reflect atelectasis.  There is no evidence of pleural effusion or pneumothorax.   The heart is borderline normal in size; the mediastinal contour is within normal limits.  No acute osseous abnormalities are seen.  IMPRESSION: Minimal bibasilar opacities likely reflect atelectasis.  Original Report Authenticated By: Tonia Ghent, M.D.   Ct Head Wo Contrast  02/23/2011  *RADIOLOGY REPORT*  Clinical Data: Altered mental status; grand mal seizure. Incontinence.  CT HEAD WITHOUT CONTRAST  Technique:  Contiguous axial images were obtained from the base of the skull through the vertex without contrast.  Comparison: CT of the head performed 11/19/2010  Findings: There is no evidence of acute infarction, mass lesion, or intra- or extra-axial hemorrhage on CT.  Evaluation is mildly suboptimal due to motion artifact.  Prominence of the ventricles and sulci reflects mild cortical volume loss.  Scattered periventricular and subcortical white matter change likely reflects small vessel ischemic microangiopathy.  Chronic lacunar infarcts are seen within the basal ganglia bilaterally.  Mild cerebellar atrophy is noted.  The brainstem and fourth ventricle are within normal limits.  The cerebral hemispheres demonstrate grossly normal gray-white differentiation.  No mass effect or midline shift is seen.  There is no evidence of fracture; visualized osseous structures are unremarkable in appearance.  The orbits are within normal limits. There is mild partial opacification of the left ethmoid air cells. The paranasal sinuses and mastoid air cells are well-aerated.  No significant soft tissue abnormalities are seen.  IMPRESSION:  1.  No acute intracranial pathology seen. 2.  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 3.  Persistent mild partial opacification of the left ethmoid air cells.  Original Report Authenticated By: Tonia Ghent, M.D.    Medications: Scheduled Meds:   . folic acid  1 mg Oral Daily  .  levETIRAcetam  1,500 mg Oral BID  . lipase/protease/amylase  1 capsule Oral TID AC  . LORazepam  0-4 mg Intravenous Q12H  . phenytoin  300 mg Oral QHS  . thiamine  100 mg Oral Daily   Continuous Infusions:   . 0.9 % NaCl with KCl 40 mEq / L 20 mL/hr (02/25/11 1820)   PRN Meds:.acetaminophen, hydrALAZINE, LORazepam, ondansetron (ZOFRAN) IV, ondansetron, senna-docusate  Assessment/Plan: 1. Seizure disorder: As per neurology recommendation patient will be on both oral Dilantin and Keppra. 2. Alcohol dependence: Patient denies history of alcohol abuse. He does not demonstrate any features of withdrawal at this time. 3. Encephalopathy: Secondary to postictal state complicating underlying dementia. Patient's baseline mental status is not known. This is obviously improved compared to that on admission. 4. Anemia: Stable  Disposition: We'll get physical therapy occupational therapy to evaluate and possible discharge tomorrow.      Angeles Paolucci 02/26/2011, 5:16 PM

## 2011-02-26 NOTE — Progress Notes (Signed)
Attempted eval, pt refused this am due to abdominal pain.  Will reattempt 12/2 pm or 12/3 am as schedule allows to complete eval for SNF placement. Narda Amber, PT, MS, DPT

## 2011-02-27 MED ORDER — PHENYTOIN SODIUM EXTENDED 300 MG PO CAPS: 300.0000 mg | ORAL_CAPSULE | Freq: Every day | ORAL | Status: DC

## 2011-02-27 MED ORDER — FOLIC ACID 1 MG PO TABS: 1.0000 mg | ORAL_TABLET | Freq: Every day | ORAL | Status: AC

## 2011-02-27 MED ORDER — LEVETIRACETAM 100 MG/ML PO SOLN: 1500.0000 mg | Freq: Two times a day (BID) | ORAL | Status: DC

## 2011-02-27 NOTE — Progress Notes (Signed)
Physical Therapy Evaluation Patient Details Name: [name] [name] MRN: [id number] DOB: 1950-09-30 Today's Date: 02/27/2011  Problem List:  Patient Active Problem List  Diagnoses  . Seizure  . Alcohol abuse  . HTN (hypertension)  . Leukocytosis  . Hypokalemia  . Hyperglycemia  . Encephalopathy acute  . Dementia associated with alcoholism    Past Medical History:  Past Medical History  Diagnosis Date  . Seizure disorder 2001     EEG 07/27/10, alcohol abuse & med noncomplliance, began after traumatic subarachnoid hem  . Alcohol abuse   . CVA (cerebral infarction)   . 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 HTN (hypertension)   . GERD (gastroesophageal reflux disease)   . Pancreatitis chronic   . Lung nodule   . Esophageal ulcer without bleeding    Past Surgical History: History reviewed. No pertinent past surgical history.  PT Assessment/Plan/Recommendation PT Assessment Clinical Impression Statement: Pt is a 60 y/o male admitted s/p seizure along with the below PT problem list.  Pt would benefit from PT in the acute care setting to maximize independence and facilitate d/c home. PT Recommendation/Assessment: Patient will need skilled PT in the acute care venue PT Problem List: Decreased activity tolerance;Decreased balance;Decreased mobility;Pain Barriers to Discharge: None PT Therapy Diagnosis : Acute pain;Difficulty walking PT Plan PT Frequency: Min 3X/week PT Treatment/Interventions: Gait training;Functional mobility training;Balance training;Patient/family education;Therapeutic activities PT Recommendation Follow Up Recommendations: Home health PT Equipment Recommended: None recommended by PT PT Goals  Acute Rehab PT Goals PT Goal Formulation: With patient Time For Goal Achievement: 7 days Pt will go Sit to Stand: with modified independence PT Goal: Sit to Stand - Progress: Other (comment) (Set today.) Pt will go Stand to Sit: with modified  independence PT Goal: Stand to Sit - Progress: Other (comment) (Set today.) Pt will Ambulate: >150 feet;with modified independence;with least restrictive assistive device PT Goal: Ambulate - Progress: Other (comment) (Set today.)  PT Evaluation Precautions/Restrictions  Precautions Precautions: Fall Required Braces or Orthoses: No Restrictions Weight Bearing Restrictions: No Prior Functioning  Home Living Lives With: Spouse Receives Help From: Family Type of Home: Apartment Home Layout: One level Home Access: Level entry Bathroom Shower/Tub: Engineer, manufacturing systems: Standard Home Adaptive Equipment: None Prior Function Level of Independence: Independent with basic ADLs;Independent with gait;Independent with transfers Able to Take Stairs?: Reciprically Driving: No Vocation: Retired Producer, television/film/video: Awake/alert Overall Cognitive Status: Appears within functional limits for tasks assessed Orientation Level: Oriented X4 Sensation/Coordination Sensation Light Touch: Appears Intact Stereognosis: Not tested Hot/Cold: Not tested Proprioception: Not tested Coordination Gross Motor Movements are Fluid and Coordinated: Yes Fine Motor Movements are Fluid and Coordinated: Not tested Extremity Assessment RUE Assessment RUE Assessment: Not tested LUE Assessment LUE Assessment: Not tested RLE Assessment RLE Assessment: Within Functional Limits LLE Assessment LLE Assessment: Within Functional Limits Pain 10/10 in stomach.  RN aware and pt repositioned. Mobility (including Balance) Bed Mobility Bed Mobility: Yes Supine to Sit: 6: Modified independent (Device/Increase time) Supine to Sit Details (indicate cue type and reason): Increased time. Sit to Supine - Left: 6: Modified independent (Device/Increase time) Transfers Transfers: Yes Sit to Stand: 5: Supervision;From bed Sit to Stand Details (indicate cue type and reason): Verbal cues for safest  hand placement. Stand to Sit: 5: Supervision;To bed Stand to Sit Details: Verbal cues for safest hand placement. Ambulation/Gait Ambulation/Gait: Yes Ambulation/Gait Assistance: 4: Min assist Ambulation/Gait Assistance Details (indicate cue type and reason): Assist for balance due to antalgic gait  pattern. Ambulation Distance (Feet): 60 Feet Assistive device: None Gait Pattern: Antalgic Stairs: No Wheelchair Mobility Wheelchair Mobility: No  Posture/Postural Control Posture/Postural Control: No significant limitations Balance Balance Assessed: No Exercise    End of Session PT - End of Session Equipment Utilized During Treatment: Gait belt Activity Tolerance: Patient tolerated treatment well Patient left: in bed;with call bell in reach Nurse Communication: Mobility status for transfers;Mobility status for ambulation General Behavior During Session: [facility] for tasks performed Cognition: [facility] for tasks performed  Cephus Shelling 02/27/2011, 1:38 PM  02/27/2011 Cephus Shelling, PT, DPT [phone]

## 2011-02-27 NOTE — Progress Notes (Signed)
Occupational Therapy Evaluation Patient Details Name: [name] [name] MRN: [id number] DOB: 1950/08/23 Today's Date: 02/27/2011  Problem List:  Patient Active Problem List  Diagnoses  . Seizure  . Alcohol abuse  . HTN (hypertension)  . Leukocytosis  . Hypokalemia  . Hyperglycemia  . Encephalopathy acute  . Dementia associated with alcoholism    Past Medical History:  Past Medical History  Diagnosis Date  . Seizure disorder 2001     EEG 07/27/10, alcohol abuse & med noncomplliance, began after traumatic subarachnoid hem  . Alcohol abuse   . CVA (cerebral infarction)   . 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 HTN (hypertension)   . GERD (gastroesophageal reflux disease)   . Pancreatitis chronic   . Lung nodule   . Esophageal ulcer without bleeding    Past Surgical History: History reviewed. No pertinent past surgical history.  OT Assessment/Plan/Recommendation OT Assessment Clinical Impression Statement: Patient with long-standing seizure disorder admitted for seizure. Patient near baseline functioning although more easily fatigued and limited mostly by eipgastric pain. Will benefit from skilled OT in the acute setting to maximinze independence with ADL and ADL mobility to Mod I/I upon d/c home. OT Recommendation/Assessment: Patient will need skilled OT in the acute care venue OT Problem List: Decreased strength;Decreased activity tolerance;Decreased knowledge of precautions;Decreased knowledge of use of DME or AE;Pain OT Therapy Diagnosis : Generalized weakness;Acute pain OT Plan OT Frequency: Min 2X/week OT Treatment/Interventions: Self-care/ADL training;Therapeutic exercise;Therapeutic activities;Patient/family education;DME and/or AE instruction OT Recommendation Follow Up Recommendations: None Equipment Recommended:  (TBD) Individuals Consulted Consulted and Agree with Results and Recommendations: Patient OT Goals Acute Rehab OT Goals OT Goal Formulation:  With patient Time For Goal Achievement: 7 days ADL Goals Pt Will Perform Upper Body Bathing: Independently ADL Goal: Upper Body Bathing - Progress: Other (comment) Pt Will Perform Lower Body Bathing: Independently ADL Goal: Lower Body Bathing - Progress: Other (comment) Pt Will Transfer to Toilet: Independently;with modified independence (DME prn) ADL Goal: Toilet Transfer - Progress: Other (comment) Pt Will Perform Tub/Shower Transfer: Tub transfer;Independently ADL Goal: Tub/Shower Transfer - Progress: Other (comment)  OT Evaluation Precautions/Restrictions  Precautions Precautions: Fall Restrictions Weight Bearing Restrictions: No Prior Functioning Home Living Lives With: Spouse Receives Help From: Family Type of Home: Apartment Home Layout: One level Bathroom Shower/Tub: Engineer, manufacturing systems: Standard Prior Function Level of Independence: Independent with basic ADLs Able to Take Stairs?: Reciprically Driving: No Vocation: Retired ADL ADL Eating/Feeding: Simulated;Independent Where Assessed - Eating/Feeding: Chair Grooming: Simulated;Supervision/safety Where Assessed - Grooming: Standing at sink Upper Body Bathing: Simulated;Supervision/safety Upper Body Bathing Details (indicate cue type and reason): easily fatigued and requires rest breaks Where Assessed - Upper Body Bathing: Shower (standing) Lower Body Bathing: Simulated;Supervision/safety Where Assessed - Lower Body Bathing: Shower (standing) Upper Body Dressing: Simulated;Set up Where Assessed - Upper Body Dressing: Sitting, bed Lower Body Dressing: Simulated;Set up Where Assessed - Lower Body Dressing: Sit to stand from bed Toilet Transfer: Simulated;Supervision/safety Toilet Transfer Details (indicate cue type and reason): simulated EOB to chair Toilet Transfer Method: Ambulating ADL Comments: Patient appears to be near baseline functioning although easily fatigued. Vision/Perception  Vision -  History Patient Visual Report: No change from baseline Cognition Cognition Arousal/Alertness: Lethargic Sensation/Coordination Sensation Light Touch: Appears Intact Coordination Gross Motor Movements are Fluid and Coordinated: Yes Fine Motor Movements are Fluid and Coordinated: Yes Extremity Assessment RUE Assessment RUE Assessment:  (Grossly 4/5) LUE Assessment LUE Assessment:  (Grossly 4/5) Mobility  Bed Mobility Bed Mobility:  Yes Supine to Sit: 4: Min assist Supine to Sit Details (indicate cue type and reason): patient with difficulty coming to sit secondary to stomach pain Transfers Transfers: Yes Sit to Stand: 5: Supervision;From bed Stand to Sit: 5: Supervision;To chair/3-in-1 (VC to control descent) End of Session OT - End of Session Equipment Utilized During Treatment: Gait belt Activity Tolerance: Patient limited by pain;Patient limited by fatigue Patient left: in chair Nurse Communication: Mobility status for transfers;Mobility status for ambulation General Behavior During Session: Lethargic Cognition: Impaired (Able to state location but not able to state date.)   [name] [name] 02/27/2011, 10:23 AM

## 2011-02-27 NOTE — Progress Notes (Signed)
Spoke with patient about HHPT. He states that he has had HHC previously but he does not remember the name of the agency. He would like for his wife to make the decision regarding the [facility] agency.Called the patient's home and left VM. Called home #again and his daughter answered and stated that Ms Galluzzo had 3pm appt in [place] and had not returned yet. Spoke with patient about HHPT again. Patient is in agreement that I will call wife in am to set up HHPT after d/c.

## 2011-02-27 NOTE — Progress Notes (Signed)
Patients wife called, and said she wasn't prepared to take patient home tonight. Dr. Truitt Merle.  Discharge held until am.

## 2011-02-27 NOTE — Discharge Summary (Addendum)
Discharge Summary  JOHNCHRISTOPHER SARVIS MR#: [id number]  DOB:08-30-1950  Date of Admission: 02/22/2011 Date of Discharge: 02/28/2011  Patient's PCP: Dr. Myriam Jacobson at [facility]  Attending Physician:Dede Dobesh  Consults: 1.  Neurology  Discharge Diagnoses: 1. Seizure disorder (complex partial seizure with secondary generalization) 2. Alcohol dependence 3. History of CVA 4. Dementia 5. Hypertension 6. Gastroesophageal reflux disease 7. History of chronic pancreatitis 8. History of lung nodule 9. History of esophageal ulcer 10. 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 Hypokalemia 12. Post ictal encephalopathy, resolved 13. Leukocytosis 14. Question medication noncompliance 15. Anemia 16. Newly diagnosed type 2 diabetes    Brief Admitting History and Physical Patient is a 60 year old African American male with history of seizure disorder, alcohol dependence and possible alcohol withdrawal seizure, CVA, dementia, chronic abdominal pain was being evaluated with upper and lower endoscopies recently according to his spouse, was brought by EMS to the hospital when family noted that he had tonic-clonic seizures. In the emergency room patient was post ictal and then had a tonic-clonic seizure. He was admitted for further evaluation and management. He was mildly hypertensive and somnolent not following commands. His mucosa were slightly dry. There were no focal neurological deficits. Potassium was low at 2.9 and he had a leukocytosis of 21,000.  Discharge Medications Current Discharge Medication List    CONTINUE these medications which have CHANGED   Details  folic acid (FOLVITE) 1 MG tablet Take 1 tablet (1 mg total) by mouth daily.    levETIRAcetam (KEPPRA) 100 MG/ML solution Take 15 mLs (1,500 mg total) by mouth 2 (two) times daily. Qty: 473 mL, Refills: 0    phenytoin (DILANTIN) 300 MG ER capsule Take 1  capsule (300 mg total) by mouth at bedtime. Qty: 30 capsule, Refills: 0      CONTINUE these medications which have NOT CHANGED   Details  aspirin 81 MG tablet Take 81 mg by mouth daily.      donepezil (ARICEPT) 10 MG tablet Take 5 mg by mouth at bedtime.     ferrous sulfate 325 (65 FE) MG tablet Take 325 mg by mouth 2 (two) times daily.      lisinopril (PRINIVIL,ZESTRIL) 20 MG tablet Take 20 mg by mouth daily.      memantine (NAMENDA) 5 MG tablet Take 5 mg by mouth daily.     omeprazole (PRILOSEC) 40 MG capsule Take 40 mg by mouth 2 (two) times daily.      ondansetron (ZOFRAN-ODT) 4 MG disintegrating tablet Take 4 mg by mouth every 8 (eight) hours as needed. For vomiting     Pancrelipase, Lip-Prot-Amyl, (CREON) 6000 UNITS CPEP Take 1 each by mouth 3 (three) times daily.     thiamine 250 MG tablet Take 250 mg by mouth daily.        STOP taking these medications     amLODipine (NORVASC) 5 MG tablet Comments:  Reason for Stopping:          Hospital Course: 1. Seizure disorder. Patient apparently has long history of seizures and there is some question regarding his compliance with medications. He was admitted to the hospital and placed on seizure precautions. Neurology consulted. He was started on both Dilantin and Keppra via the intravenous preparation. He was initially admitted to the step down/ICU. Once he was stabilized he was transferred to the regular bed. He was encephalopathic from his postictal state. He has gradually improved and his mental status has cleared  up. Do not know what his baseline mental status is however. He is advised not to drive. He is also advised compliance with his medications. 2. Alcohol dependence: According to patient's spouse he drinks heavily. He was placed on Ativan withdrawal protocol. This is completed. He does not demonstrate any further features of alcohol withdrawal. Continue multivitamins. Recommend alcohol abstinence. 3. History of CVA:  Continue home dose of baby aspirin. 4. Dementia: Continue home Namenda and Aricept. Unclear what the patient's baseline mental status is. 5. Chronic abdominal pain: Etiologies could be chronic pancreatitis, gastritis or gastric ulcer which he's had according to his wife. He is advised to stop drinking alcohol and he can continue his PPIs. 6. Newly diagnosed type 2 diabetes by A1c criteria. Outpatient followup. 7. Leukocytosis: Likely stress from the seizures. No clinical sepsis. Off antibiotics.   Day of Discharge BP 141/82  Pulse 81  Temp(Src) 98.4 F (36.9 C) (Oral)  Resp 20  Ht 5\' 9"  (1.753 m)  Wt 50.259 kg (110 lb 12.8 oz)  BMI 16.36 kg/m2  SpO2 100%  General exam: Patient is comfortable and in no distress. Respiratory system: Clear. Cardiovascular system first and second heart sounds heard, regular Gastrointestinal system: Abdomen is nondistended, soft and normal bowel sounds present. Nontender. Central nervous system: Alert and oriented to person and place but not to time. No focal neurological deficits.  Lab data 1. Phenytoin level on December 1: 16.9 2. BMP on December 1: Unremarkable. BUN was 9 and creatinine 0.6 3. CBC on December 1 significant for hemoglobin 11.3 white cell 9.2 and platelets 229 4. Hemoglobin A1c 5.6 but 3 months ago it was 6.5. 5. TSH 2.107  Dg Chest 2 View  02/23/2011  *RADIOLOGY REPORT*  Clinical Data: Shortness of breath.  CHEST - 2 VIEW  Comparison: Chest radiograph performed 11/19/2010  Findings: The lungs are well-aerated.  Minimal bibasilar opacities are similar to prior studies and likely reflect atelectasis.  There is no evidence of pleural effusion or pneumothorax.  The heart is borderline normal in size; the mediastinal contour is within normal limits.  No acute osseous abnormalities are seen.  IMPRESSION: Minimal bibasilar opacities likely reflect atelectasis.  Original Report Authenticated By: Tonia Ghent, M.D.   Ct Head Wo  Contrast  02/23/2011  *RADIOLOGY REPORT*  Clinical Data: Altered mental status; grand mal seizure. Incontinence.  CT HEAD WITHOUT CONTRAST  Technique:  Contiguous axial images were obtained from the base of the skull through the vertex without contrast.  Comparison: CT of the head performed 11/19/2010  Findings: There is no evidence of acute infarction, mass lesion, or intra- or extra-axial hemorrhage on CT.  Evaluation is mildly suboptimal due to motion artifact.  Prominence of the ventricles and sulci reflects mild cortical volume loss.  Scattered periventricular and subcortical white matter change likely reflects small vessel ischemic microangiopathy.  Chronic lacunar infarcts are seen within the basal ganglia bilaterally.  Mild cerebellar atrophy is noted.  The brainstem and fourth ventricle are within normal limits.  The cerebral hemispheres demonstrate grossly normal gray-white differentiation.  No mass effect or midline shift is seen.  There is no evidence of fracture; visualized osseous structures are unremarkable in appearance.  The orbits are within normal limits. There is mild partial opacification of the left ethmoid air cells. The paranasal sinuses and mastoid air cells are well-aerated.  No significant soft tissue abnormalities are seen.  IMPRESSION:  1.  No acute intracranial pathology seen. 2.  Mild cortical volume loss and scattered small  vessel ischemic microangiopathy; chronic lacunar infarcts within the basal ganglia bilaterally. 3.  Persistent mild partial opacification of the left ethmoid air cells.  Original Report Authenticated By: Tonia Ghent, M.D.     Disposition: Patient is discharged home with home health PT in stable condition  Diet: Heart healthy diet  Activity: Increase activity gradually. No driving until cleared by M.D.  Follow-up Appts: Discharge Orders    Future Orders Please Complete By Expires   Diet - low sodium heart healthy      Diet - low sodium heart  healthy      Increase activity slowly      Driving Restrictions      Comments:   Do not drive until cleared by M.D.   No wound care      Call MD for:  persistant nausea and vomiting      Call MD for:  severe uncontrolled pain      Increase activity slowly      Driving Restrictions      Comments:   No driving until cleared by M.D.   Call MD for:  severe uncontrolled pain      Call MD for:  persistant nausea and vomiting         TESTS THAT NEED FOLLOW-UP None  Time spent on discharge, talking to the patient, and coordinating care: 30 mins.   Addendum: Patient was prepared for discharge yesterday but his family could not pick him up. Hence his discharge was canceled. Patient continues to complain of some chronic upper abdominal pain. He has soft yellow stools. No nausea vomiting. He is tolerating current diet. He's had no further seizures His vitals and physical exam are otherwise stable. His wife is here to pick him up. Patient will be discharged home in stable condition.  SignedMarcellus Scott, MD 02/28/2011, 12:25 PM

## 2011-02-28 MED ORDER — PHENYTOIN SODIUM EXTENDED 300 MG PO CAPS: 300.0000 mg | ORAL_CAPSULE | Freq: Every day | ORAL | Status: DC

## 2011-02-28 MED ORDER — LEVETIRACETAM 100 MG/ML PO SOLN: 1500.0000 mg | Freq: Two times a day (BID) | ORAL | Status: AC

## 2011-02-28 MED ORDER — OXYCODONE HCL 5 MG PO TABS
5.0000 mg | ORAL_TABLET | Freq: Once | ORAL | Status: AC
  Administered 2011-02-28: 5 mg via ORAL
  Filled 2011-02-28: qty 1

## 2011-02-28 NOTE — Progress Notes (Signed)
02/28/2011 [facility], Bosie Clos SPARKS Case Management Note [phone]         CARE MANAGEMENT NOTE 02/28/2011  Patient:  [name] [name], [name] [name]   Account Number:  [id number]  Date Initiated:  02/23/2011  Documentation initiated by:  Onnie Boer  Subjective/Objective Assessment:   PT WAS ADMITTED WITH AMS AND SEIZURES     Action/Plan:   PROGRESSION OF CARE AND DISCHARGE PLANNING  PT eval-recommending HHPT   Anticipated DC Date:  02/28/2011   Anticipated DC Plan:  HOME W HOME HEALTH SERVICES  In-house referral  Clinical Social Worker      DC Planning Services  CM consult      Choice offered to / List presented to:  C-3 Spouse        HH arranged  HH-2 PT      HH agency  [facility]   Status of service:  Completed, signed off Medicare Important Message given?   (If response is "NO", the following Medicare IM given date fields will be blank) Date Medicare IM given:   Date Additional Medicare IM given:    Discharge Disposition:  HOME W HOME HEALTH SERVICES  Per UR Regulation:  Reviewed for med. necessity/level of care/duration of stay  Comments:  PCP Dr. Myriam Jacobson in [place] [phone]  02/28/11 Spoke with patient's wife, she requested [facility] for HHPT. Contacted Debbie at [facility] and set up HHPT. Requested entered in [facility].  02/27/11 Spoke with patient about HHPT. He states that he has had HHC previously but he does not remember the name of the agency. He would like for his wife to make the decision regarding the [facility] agency.Called the patient's home and left VM. Called home #again and his daughter answered and stated that Ms Hudgins had 3pm appt in [place] and had not returned yet. Spoke with patient about HHPT again. Patient is in agreement that I will call wife in am to set up HHPT after d/c. Jacquelynn Cree RN, BSN, CCM  02/23/2011 Onnie Boer, RN, BSN  1548 PT WAS ADMITTED WITH AMS AND SEIZURES.   PTA PT'S WIFE LINDA STATED THAT THE PT WAS  VERY DEMENTED LEAVING THE HOUSE STAYING GONE ALL DAY, NOT TAKING MEDS, NOT HAVING PERSONAL HYGIENE, REFUSING HELP FROM FAMILY FOR CARE.  PT'S WIFE STATES THAT SHE FOUND HIM IN [facility] [place] AND WHEN HELPING HIM CLEAN UP HE STARTED HAVING A SEIZURE, SHE ALSO STATED THAT HE SLEEPS ALOT AFTER HE TAKES HIS MEDS. PT WAS JUST DC'D FROM [facility] IN [place] FOR SHORT TERM REHAB.  PT WAS SEEING DR PONDER IN [place] FOR STOMACH PAINS AND WAS TO HAVE A CT ON THIS FRIDAY AT THE CLINIC. MD/ CSW MADE AWARE OF ALL OF THE ABOVE, WILL F/U ON  DC NEEDS

## 2011-02-28 NOTE — Progress Notes (Signed)
   CARE MANAGEMENT NOTE 02/28/2011  Patient:  [name] [name], [name] [name]   Account Number:  [id number]  Date Initiated:  02/23/2011  Documentation initiated by:  Onnie Boer  Subjective/Objective Assessment:   PT WAS ADMITTED WITH AMS AND SEIZURES     Action/Plan:   PROGRESSION OF CARE AND DISCHARGE PLANNING  PT eval-recommending HHPT   Anticipated DC Date:  02/28/2011   Anticipated DC Plan:  HOME W HOME HEALTH SERVICES  In-house referral  Clinical Social Worker      DC Planning Services  CM consult      Choice offered to / List presented to:  C-3 Spouse        HH arranged  HH-2 PT      HH agency  [facility]   Status of service:  Completed, signed off Medicare Important Message given?   (If response is "NO", the following Medicare IM given date fields will be blank) Date Medicare IM given:   Date Additional Medicare IM given:    Discharge Disposition:  HOME W HOME HEALTH SERVICES  Per UR Regulation:  Reviewed for med. necessity/level of care/duration of stay  Comments:  PCP Dr. Myriam Jacobson in [place] [phone]  02/28/11 Spoke with patient's wife, she requested [facility] for HHPT. Contacted Debbie at [facility] and set up HHPT. Requested entered in [facility].  02/27/11 Spoke with patient about HHPT. He states that he has had HHC previously but he does not remember the name of the agency. He would like for his wife to make the decision regarding the [facility] agency.Called the patient's home and left VM. Called home #again and his daughter answered and stated that Ms Capistran had 3pm appt in [place] and had not returned yet. Spoke with patient about HHPT again. Patient is in agreement that I will call wife in am to set up HHPT after d/c. Jacquelynn Cree RN, BSN, CCM  02/23/2011 Onnie Boer, RN, BSN  1548 PT WAS ADMITTED WITH AMS AND SEIZURES.   PTA PT'S WIFE LINDA STATED THAT THE PT WAS VERY DEMENTED LEAVING THE HOUSE STAYING GONE ALL DAY, NOT TAKING MEDS,  NOT HAVING PERSONAL HYGIENE, REFUSING HELP FROM FAMILY FOR CARE.  PT'S WIFE STATES THAT SHE FOUND HIM IN [facility] CONT[place] AND WHEN HELPING HIM CLEAN UP HE STARTED HAVING A SEIZURE, SHE ALSO STATED THAT HE SLEEPS ALOT AFTER HE TAKES HIS MEDS. PT WAS JUST DC'D FROM [facility] IN [place] FOR SHORT TERM REHAB.  PT WAS SEEING DR PONDER IN [place] FOR STOMACH PAINS AND WAS TO HAVE A CT ON THIS FRIDAY AT THE CLINIC. MD/ CSW MADE AWARE OF ALL OF THE ABOVE, WILL F/U ON  DC NEEDS

## 2011-03-11 ENCOUNTER — Emergency Department: Payer: Self-pay | Admitting: Unknown Physician Specialty

## 2011-03-27 ENCOUNTER — Inpatient Hospital Stay (HOSPITAL_COMMUNITY)
Admission: EM | Admit: 2011-03-27 | Discharge: 2011-03-30 | DRG: 100 | Disposition: A | Discharge status: Home / Self Care | Payer: Medicare Other | Attending: Internal Medicine | Admitting: Internal Medicine

## 2011-03-27 ENCOUNTER — Encounter (HOSPITAL_COMMUNITY): Payer: Self-pay | Admitting: Family Medicine

## 2011-03-27 ENCOUNTER — Other Ambulatory Visit: Payer: Self-pay

## 2011-03-27 ENCOUNTER — Emergency Department (HOSPITAL_COMMUNITY): Payer: Medicare Other

## 2011-03-27 DIAGNOSIS — E876 Hypokalemia: Secondary | ICD-10-CM

## 2011-03-27 DIAGNOSIS — F1027 Alcohol dependence with alcohol-induced persisting dementia: Secondary | ICD-10-CM

## 2011-03-27 DIAGNOSIS — D72829 Elevated white blood cell count, unspecified: Secondary | ICD-10-CM

## 2011-03-27 DIAGNOSIS — R4789 Other speech disturbances: Secondary | ICD-10-CM | POA: Diagnosis present

## 2011-03-27 DIAGNOSIS — G9349 Other encephalopathy: Secondary | ICD-10-CM | POA: Diagnosis present

## 2011-03-27 DIAGNOSIS — F1020 Alcohol dependence, uncomplicated: Secondary | ICD-10-CM | POA: Diagnosis present

## 2011-03-27 DIAGNOSIS — Z91148 Patient's other noncompliance with medication regimen for other reason: Secondary | ICD-10-CM

## 2011-03-27 DIAGNOSIS — F0390 Unspecified dementia without behavioral disturbance: Secondary | ICD-10-CM

## 2011-03-27 DIAGNOSIS — Z91199 Patient's noncompliance with other medical treatment and regimen due to unspecified reason: Secondary | ICD-10-CM

## 2011-03-27 DIAGNOSIS — Z9119 Patient's noncompliance with other medical treatment and regimen: Secondary | ICD-10-CM

## 2011-03-27 DIAGNOSIS — Z720 Tobacco use: Secondary | ICD-10-CM

## 2011-03-27 DIAGNOSIS — Z8673 Personal history of transient ischemic attack (TIA), and cerebral infarction without residual deficits: Secondary | ICD-10-CM

## 2011-03-27 DIAGNOSIS — G40901 Epilepsy, unspecified, not intractable, with status epilepticus: Secondary | ICD-10-CM

## 2011-03-27 DIAGNOSIS — R739 Hyperglycemia, unspecified: Secondary | ICD-10-CM

## 2011-03-27 DIAGNOSIS — Z9114 Patient's other noncompliance with medication regimen: Secondary | ICD-10-CM

## 2011-03-27 DIAGNOSIS — F1010 Alcohol abuse, uncomplicated: Secondary | ICD-10-CM

## 2011-03-27 DIAGNOSIS — Z8719 Personal history of other diseases of the digestive system: Secondary | ICD-10-CM

## 2011-03-27 DIAGNOSIS — R569 Unspecified convulsions: Principal | ICD-10-CM

## 2011-03-27 DIAGNOSIS — G9340 Encephalopathy, unspecified: Secondary | ICD-10-CM

## 2011-03-27 DIAGNOSIS — F17200 Nicotine dependence, unspecified, uncomplicated: Secondary | ICD-10-CM | POA: Diagnosis present

## 2011-03-27 DIAGNOSIS — I10 Essential (primary) hypertension: Secondary | ICD-10-CM

## 2011-03-27 LAB — BASIC METABOLIC PANEL
CO2: 29 mEq/L (ref 19–32)
GFR calc non Af Amer: >90 mL/min (ref >90)
Glucose, Bld: 143 mg/dL — ABNORMAL HIGH (ref 70–99)
Potassium: 5.1 mEq/L (ref 3.5–5.1)
Sodium: 138 mEq/L (ref 135–145)

## 2011-03-27 LAB — RAPID URINE DRUG SCREEN, HOSP PERFORMED
Barbiturates: NOT DETECTED
Benzodiazepines: POSITIVE — AB
Opiates: NOT DETECTED
Tetrahydrocannabinol: NOT DETECTED

## 2011-03-27 LAB — PHENYTOIN LEVEL, TOTAL: Phenytoin Lvl: <2.5 ug/mL — ABNORMAL LOW (ref 10.0–20.0)

## 2011-03-27 LAB — GLUCOSE, CAPILLARY: Glucose-Capillary: 151 mg/dL — ABNORMAL HIGH (ref 70–99)

## 2011-03-27 LAB — CBC
MCV: 85 fL (ref 78.0–100.0)
Platelets: 254 10*3/uL (ref 150–400)
RBC: 3.53 MIL/uL — ABNORMAL LOW (ref 4.22–5.81)
WBC: 8.3 10*3/uL (ref 4.0–10.5)

## 2011-03-27 LAB — URINALYSIS, ROUTINE W REFLEX MICROSCOPIC
Bilirubin Urine: NEGATIVE
Ketones, ur: NEGATIVE mg/dL
Leukocytes, UA: NEGATIVE
Nitrite: NEGATIVE
Specific Gravity, Urine: 1.015 (ref 1.005–1.030)
Urobilinogen, UA: 0.2 mg/dL (ref 0.0–1.0)

## 2011-03-27 MED ORDER — SODIUM CHLORIDE 0.9 % IV SOLN
700.0000 mg | Freq: Once | INTRAVENOUS | Status: AC
  Administered 2011-03-28: 700 mg via INTRAVENOUS
  Filled 2011-03-27: qty 14

## 2011-03-27 MED ORDER — SODIUM CHLORIDE 0.9 % IV BOLUS (SEPSIS)
500.0000 mL | INTRAVENOUS | Status: AC
  Administered 2011-03-27: 500 mL via INTRAVENOUS

## 2011-03-27 MED ORDER — SODIUM CHLORIDE 0.9 % IV SOLN
500.0000 mg | Freq: Once | INTRAVENOUS | Status: AC
  Administered 2011-03-27: 500 mg via INTRAVENOUS
  Filled 2011-03-27: qty 10

## 2011-03-27 MED ORDER — LORAZEPAM 2 MG/ML IJ SOLN
INTRAMUSCULAR | Status: AC
  Filled 2011-03-27: qty 1

## 2011-03-27 MED ORDER — THIAMINE HCL 100 MG/ML IJ SOLN
100.0000 mg | Freq: Once | INTRAMUSCULAR | Status: AC
  Administered 2011-03-27: 100 mg via INTRAVENOUS
  Filled 2011-03-27: qty 2

## 2011-03-27 MED ORDER — SODIUM CHLORIDE 0.9 % IV SOLN
500.0000 mg | Freq: Two times a day (BID) | INTRAVENOUS | Status: DC
  Administered 2011-03-27: 500 mg via INTRAVENOUS
  Filled 2011-03-27 (×3): qty 5

## 2011-03-27 NOTE — H&P (Addendum)
PCP:  Unknown  Chief Complaint:  Seizures  HPI: This is a 60 year old gentleman with known history of alcohol abuse, seizures, history of CVA, intracerebral hemorrhage, dementia and medical noncompliance. He was brought in today after he had a witnessed seizure at home. Here in the ER he has continued to have seizures, per Dr. Patrica Duel the patient has had approximately 4 different seizure episodes. He has received Versed, Ativan and Keppra. Dilantin has been added since his Dilantin level is low. Currently patient is post ictal and unable to certain questions. He does continue to have some myoclonic twitches. No further history available.  Review of Systems:  Unable to obtain due to patient's mentation  Past Medical History: Past Medical History  Diagnosis Date  . Seizure disorder 2001     EEG 07/27/10, alcohol abuse & med noncomplliance, began after traumatic subarachnoid hem  . Alcohol abuse   . CVA (cerebral infarction)   . 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 HTN (hypertension)   . GERD (gastroesophageal reflux disease)   . Pancreatitis chronic   . Lung nodule   . Esophageal ulcer without bleeding    Past Surgical History  Procedure Date  . None     Medications: Prior to Admission medications   Medication Sig Start Date End Date Taking? Authorizing Provider  aspirin 81 MG tablet Take 81 mg by mouth daily.     Yes Historical Provider, MD  donepezil (ARICEPT) 10 MG tablet Take 5 mg by mouth at bedtime.    Yes Historical Provider, MD  ferrous sulfate 325 (65 FE) MG tablet Take 325 mg by mouth 2 (two) times daily.     Yes Historical Provider, MD  folic acid (FOLVITE) 1 MG tablet Take 1 tablet (1 mg total) by mouth daily. 02/27/11 02/27/12 Yes Anand Hongalgi  levETIRAcetam (KEPPRA) 100 MG/ML solution Take 15 mLs (1,500 mg total) by mouth 2 (two) times daily. 02/28/11  Yes Anand Hongalgi  lisinopril (PRINIVIL,ZESTRIL) 20 MG tablet Take 20 mg by mouth daily.     Yes  Historical Provider, MD  memantine (NAMENDA) 5 MG tablet Take 5 mg by mouth daily.    Yes Historical Provider, MD  omeprazole (PRILOSEC) 40 MG capsule Take 40 mg by mouth 2 (two) times daily.     Yes Historical Provider, MD  ondansetron (ZOFRAN-ODT) 4 MG disintegrating tablet Take 4 mg by mouth every 8 (eight) hours as needed. For vomiting    Yes Historical Provider, MD  Pancrelipase, Lip-Prot-Amyl, (CREON) 6000 UNITS CPEP Take 1 each by mouth 3 (three) times daily.    Yes Historical Provider, MD  phenytoin (DILANTIN) 300 MG ER capsule Take 1 capsule (300 mg total) by mouth at bedtime. 02/28/11 02/28/12 Yes Anand Hongalgi  thiamine 250 MG tablet Take 250 mg by mouth daily.     Yes Historical Provider, MD    Allergies:  No Known Allergies  Social History:  reports that he has been smoking.  He does not have any smokeless tobacco history on file. He reports that he drinks alcohol. His drug history not on file.  Family History: Family History  Problem Relation Age of Onset  . Colon cancer Sister     Physical Exam: Filed Vitals:   03/27/11 1804 03/27/11 1805 03/27/11 1842 03/27/11 1952  BP: 180/110 161/109  137/104  Pulse: 110 109  99  Temp:  98.4 F (36.9 C)    TempSrc:  Oral    Resp:  24  16  SpO2:  100% 100% 100%    General:  Post ictal patient, well developed and nourished,  Eyes: PERRLA, pink conjunctiva, no scleral icterus ENT: Moist oral mucosa, neck supple, no thyromegaly Lungs: clear to ascultation, no wheeze, no crackles, no use of accessory muscles Cardiovascular: regular rate and rhythm, no regurgitation, no gallops, no murmurs. No carotid bruits, no JVD Abdomen: soft, positive BS, non-tender, non-distended, no organomegaly, not an acute abdomen GU: not examined Neuro: Unable to assess due to patient's mentation Musculoskeletal: Unable to assess due to patient's mentation no clubbing, cyanosis or edema Skin: no rash, no subcutaneous crepitation, no decubitus Psych  post ictal patient   Labs on Admission:   [facility] 03/27/11 1900  NA 138  K 5.1  CL 97  CO2 29  GLUCOSE 143*  BUN 12  CREATININE 0.48*  CALCIUM 8.9  MG --  PHOS --   No results found for this basename: AST:2,ALT:2,ALKPHOS:2,BILITOT:2,PROT:2,ALBUMIN:2 in the last 72 hours No results found for this basename: LIPASE:2,AMYLASE:2 in the last 72 hours  Basename 03/27/11 2028  WBC 8.3  NEUTROABS --  HGB 9.9*  HCT 30.0*  MCV 85.0  PLT 254   No results found for this basename: CKTOTAL:3,CKMB:3,CKMBINDEX:3,TROPONINI:3 in the last 72 hours No results found for this basename: TSH,T4TOTAL,FREET3,T3FREE,THYROIDAB in the last 72 hours No results found for this basename: VITAMINB12:2,FOLATE:2,FERRITIN:2,TIBC:2,IRON:2,RETICCTPCT:2 in the last 72 hours Results for JULIO, ZAPPIA (MRN [id number]) as of 03/27/2011 22:46  Ref. Range 03/27/2011 20:13  Color, Urine Latest Range: YELLOW  YELLOW  APPearance Latest Range: CLEAR  CLOUDY (A)  Specific Gravity, Urine Latest Range: 1.005-1.030  1.015  pH Latest Range: 5.0-8.0  6.0  Glucose, UA Latest Range: NEGATIVE mg/dL NEGATIVE  Bilirubin Urine Latest Range: NEGATIVE  NEGATIVE  Ketones, ur Latest Range: NEGATIVE mg/dL NEGATIVE  Protein Latest Range: NEGATIVE mg/dL NEGATIVE  Urobilinogen, UA Latest Range: 0.0-1.0 mg/dL 0.2  Nitrite Latest Range: NEGATIVE  NEGATIVE  Leukocytes, UA Latest Range: NEGATIVE  NEGATIVE  Results for YAN, PANKRATZ (MRN [id number]) as of 03/27/2011 22:46  Ref. Range 03/27/2011 20:13  Amphetamines Latest Range: NONE DETECTED  NONE DETECTED  Barbiturates Latest Range: NONE DETECTED  NONE DETECTED  Benzodiazepines Latest Range: NONE DETECTED  POSITIVE (A)  Opiates Latest Range: NONE DETECTED  NONE DETECTED  COCAINE Latest Range: NONE DETECTED  NONE DETECTED  Tetrahydrocannabinol Latest Range: NONE DETECTED  NONE DETECTED  Results for ANDREU, DRUDGE (MRN [id number]) as of 03/27/2011 22:46  Ref. Range 03/27/2011  19:00  Phenytoin Lvl Latest Range: 10.0-20.0 ug/mL <2.5 (L)   Radiological Exams on Admission: Ct Head Wo Contrast  03/27/2011  *RADIOLOGY REPORT*  Clinical Data: Status epilepticus.  CT HEAD WITHOUT CONTRAST  Technique:  Contiguous axial images were obtained from the base of the skull through the vertex without contrast.  Comparison: 02/23/2011  Findings: There is no acute intracranial hemorrhage, infarction, or mass lesion.  Diffuse atrophy without ventricular dilatation. Focal left inferior frontal encephalomalacia, stable.  No significant osseous abnormality.  IMPRESSION: No acute abnormalities.  Diffuse atrophy with focal left subfrontal encephalomalacia.  Original Report Authenticated By: Gwynn Burly, M.D.    Assessment/Plan Present on Admission:  .Seizure .Alcohol abuse .Dementia associated with alcoholism Continue IV Keppra and Dilantin  Repeat EEG not ordered, as review of history shows patient has had positive EEGs in the past  Neurologic consulted, Dr Pearlean Brownie aware N.p.o. .IV fluids and seizure precautions  Alcohol level, CIWA protocol  Cultures rule out infection causing patient's seizures  .  HTN (hypertension) . Blood pressure medications Tobacco abuse Nicotine patch Dementia History of CVA   SCDs for DVT prophylaxis Team 5/.Dr. Enos Fling, Corissa Oguinn 03/27/2011, 10:39 PM

## 2011-03-27 NOTE — ED Notes (Signed)
Pt returned from X-ray.  

## 2011-03-27 NOTE — ED Notes (Signed)
Spoke with wife on phone. States that last seizure was last month, she does not believe he has taken any drugs today but he has taken crack in the past. States that he also has dementia that "gets worse after he has a seizure" but he does have it all of the time. She will try to be up here tonight or in the morning.

## 2011-03-27 NOTE — ED Notes (Signed)
Phlebotomist stuck several times and was unable to get second set of blood cultures.

## 2011-03-27 NOTE — ED Provider Notes (Addendum)
History     CSN: [id number]  Arrival date & time 03/27/11  1756   First MD Initiated Contact with Patient 03/27/11 1853      Chief Complaint  Patient presents with  . Seizures   Patient with a known history of seizure disorder, alcohol abuse, dementia, stroke, hypertension. Had grand mal seizure described by family member in the car. When paramedics arrived, the patient had another. He was postictal upon arrival. Patient had additional seizure wall. The room with the nurses. 5 mg of Versed has been given. Patient initially was awake and had another seizure and Keppra hasn't been ordered. Patient unable to give any further history at this point in time. (Consider location/radiation/quality/duration/timing/severity/associated sxs/prior treatment) HPI  Past Medical History  Diagnosis Date  . Seizure disorder 2001     EEG 07/27/10, alcohol abuse & med noncomplliance, began after traumatic subarachnoid hem  . Alcohol abuse   . CVA (cerebral infarction)   . 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 HTN (hypertension)   . GERD (gastroesophageal reflux disease)   . Pancreatitis chronic   . Lung nodule   . Esophageal ulcer without bleeding     No past surgical history on file.  Family History  Problem Relation Age of Onset  . Colon cancer Sister     History  Substance Use Topics  . Smoking status: Smoker, Current Status Unknown  . Smokeless tobacco: Not on file  . Alcohol Use: Yes      Review of Systems  Allergies  Review of patient's allergies indicates no known allergies.  Home Medications   Current Outpatient Rx  Name Route Sig Dispense Refill  . ASPIRIN 81 MG PO TABS Oral Take 81 mg by mouth daily.      . DONEPEZIL HCL 10 MG PO TABS Oral Take 5 mg by mouth at bedtime.     Marland Kitchen FERROUS SULFATE 325 (65 FE) MG PO TABS Oral Take 325 mg by mouth 2 (two) times daily.      Marland Kitchen FOLIC ACID 1 MG PO TABS Oral Take 1 tablet (1 mg total) by mouth daily.    Marland Kitchen LEVETIRACETAM  100 MG/ML PO SOLN Oral Take 15 mLs (1,500 mg total) by mouth 2 (two) times daily. 473 mL 0  . LISINOPRIL 20 MG PO TABS Oral Take 20 mg by mouth daily.      Marland Kitchen MEMANTINE HCL 5 MG PO TABS Oral Take 5 mg by mouth daily.     Marland Kitchen OMEPRAZOLE 40 MG PO CPDR Oral Take 40 mg by mouth 2 (two) times daily.      Marland Kitchen ONDANSETRON 4 MG PO TBDP Oral Take 4 mg by mouth every 8 (eight) hours as needed. For vomiting     . PANCRELIPASE (LIP-PROT-AMYL) 6000 UNITS PO CPEP Oral Take 1 each by mouth 3 (three) times daily.     Marland Kitchen PHENYTOIN SODIUM EXTENDED 300 MG PO CAPS Oral Take 1 capsule (300 mg total) by mouth at bedtime. 30 capsule 0  . THIAMINE HCL 250 MG PO TABS Oral Take 250 mg by mouth daily.        BP 137/104  Pulse 99  Temp(Src) 98.4 F (36.9 C) (Oral)  Resp 16  SpO2 100%  Physical Exam  ED Course  Procedures (including critical care time)  Labs Reviewed  BASIC METABOLIC PANEL - Abnormal; Notable for the following:    Glucose, Bld 143 (*)    Creatinine, Ser 0.48 (*)  All other components within normal limits  GLUCOSE, CAPILLARY - Abnormal; Notable for the following:    Glucose-Capillary 151 (*)    All other components within normal limits  URINE RAPID DRUG SCREEN (HOSP PERFORMED) - Abnormal; Notable for the following:    Benzodiazepines POSITIVE (*)    All other components within normal limits  URINALYSIS, ROUTINE W REFLEX MICROSCOPIC - Abnormal; Notable for the following:    APPearance CLOUDY (*)    All other components within normal limits  CBC - Abnormal; Notable for the following:    RBC 3.53 (*)    Hemoglobin 9.9 (*)    HCT 30.0 (*)    All other components within normal limits  POCT CBG MONITORING  PHENYTOIN LEVEL, TOTAL   Ct Head Wo Contrast  03/27/2011  *RADIOLOGY REPORT*  Clinical Data: Status epilepticus.  CT HEAD WITHOUT CONTRAST  Technique:  Contiguous axial images were obtained from the base of the skull through the vertex without contrast.  Comparison: 02/23/2011  Findings:  There is no acute intracranial hemorrhage, infarction, or mass lesion.  Diffuse atrophy without ventricular dilatation. Focal left inferior frontal encephalomalacia, stable.  No significant osseous abnormality.  IMPRESSION: No acute abnormalities.  Diffuse atrophy with focal left subfrontal encephalomalacia.  Original Report Authenticated By: Gwynn Burly, M.D.     1. Status epilepticus   2. Chronic alcohol abuse       MDM  Pt is seen and examined;  Initial history and physical completed.  Will follow.   Patient has been given Ativan IV. Stat for an additional seizure. We'll also be sure to give thiamine. Keppra has been ordered. CT scan of the brain has been ordered as well. Patient is awake at this time, but will continue to follow very closely for multiple seizures.         Kathline Banbury A. Patrica Duel, MD 03/27/11 1913   Date: 03/27/2011  Rate: 111  Rhythm: sinus tachycardia  QRS Axis: normal  Intervals: normal  ST/T Wave abnormalities: nonspecific ST/T changes  Conduction Disutrbances:none  Narrative Interpretation:   Old EKG Reviewed: none available and changes noted PAC s   Results for orders placed during the hospital encounter of 03/27/11  BASIC METABOLIC PANEL      Component Value Range   Sodium 138  135 - 145 (mEq/L)   Potassium 5.1  3.5 - 5.1 (mEq/L)   Chloride 97  96 - 112 (mEq/L)   CO2 29  19 - 32 (mEq/L)   Glucose, Bld 143 (*) 70 - 99 (mg/dL)   BUN 12  6 - 23 (mg/dL)   Creatinine, Ser 9.60 (*) 0.50 - 1.35 (mg/dL)   Calcium 8.9  8.4 - 45.4 (mg/dL)   GFR calc non Af Amer >90  >90 (mL/min)   GFR calc Af Amer >90  >90 (mL/min)  GLUCOSE, CAPILLARY      Component Value Range   Glucose-Capillary 151 (*) 70 - 99 (mg/dL)  URINE RAPID DRUG SCREEN (HOSP PERFORMED)      Component Value Range   Opiates NONE DETECTED  NONE DETECTED    Cocaine NONE DETECTED  NONE DETECTED    Benzodiazepines POSITIVE (*) NONE DETECTED    Amphetamines NONE DETECTED  NONE DETECTED     Tetrahydrocannabinol NONE DETECTED  NONE DETECTED    Barbiturates NONE DETECTED  NONE DETECTED   URINALYSIS, ROUTINE W REFLEX MICROSCOPIC      Component Value Range   Color, Urine YELLOW  YELLOW    APPearance CLOUDY (*)  CLEAR    Specific Gravity, Urine 1.015  1.005 - 1.030    pH 6.0  5.0 - 8.0    Glucose, UA NEGATIVE  NEGATIVE (mg/dL)   Hgb urine dipstick NEGATIVE  NEGATIVE    Bilirubin Urine NEGATIVE  NEGATIVE    Ketones, ur NEGATIVE  NEGATIVE (mg/dL)   Protein, ur NEGATIVE  NEGATIVE (mg/dL)   Urobilinogen, UA 0.2  0.0 - 1.0 (mg/dL)   Nitrite NEGATIVE  NEGATIVE    Leukocytes, UA NEGATIVE  NEGATIVE   CBC      Component Value Range   WBC 8.3  4.0 - 10.5 (K/uL)   RBC 3.53 (*) 4.22 - 5.81 (MIL/uL)   Hemoglobin 9.9 (*) 13.0 - 17.0 (g/dL)   HCT 47.8 (*) 29.5 - 52.0 (%)   MCV 85.0  78.0 - 100.0 (fL)   MCH 28.0  26.0 - 34.0 (pg)   MCHC 33.0  30.0 - 36.0 (g/dL)   RDW 62.1  30.8 - 65.7 (%)   Platelets 254  150 - 400 (K/uL)   No results found.   [facility] paged, remains stable.  Will require admission for further care and stabalization.      CRITICAL CARE Performed by: Orlene Och.   Total critical care time:30  Critical care time was exclusive of separately billable procedures and treating other patients.  Critical care was necessary to treat or prevent imminent or life-threatening deterioration.  Critical care was time spent personally by me on the following activities: development of treatment plan with patient and/or surrogate as well as nursing, discussions with consultants, evaluation of patient's response to treatment, examination of patient, obtaining history from patient or surrogate, ordering and performing treatments and interventions, ordering and review of laboratory studies, ordering and review of radiographic studies, pulse oximetry and re-evaluation of patient's condition.      Sydnei Ohaver A. Patrica Duel, MD 03/27/11 2216

## 2011-03-27 NOTE — ED Notes (Signed)
Patient transported to X-ray 

## 2011-03-27 NOTE — ED Notes (Signed)
Patient is resting comfortably. 

## 2011-03-27 NOTE — ED Notes (Signed)
ZOX:[id number]<AV> Expected date:03/27/11<BR> Expected time: 5:49 PM<BR> Means of arrival:Ambulance<BR> Comments:<BR> EMS 61 GC - seizure

## 2011-03-27 NOTE — ED Notes (Addendum)
Pt had grand mal seizure in car with family member (passenger) then when paramedics arrived he had yet another. Post ictyl upon arrival. Then another 30sec seizure in route with EMS. 5mg  of Versed given. Hx of seizures. Noncompliant on meds. CBG 76

## 2011-03-28 ENCOUNTER — Inpatient Hospital Stay (HOSPITAL_COMMUNITY): Payer: Medicare Other

## 2011-03-28 ENCOUNTER — Encounter (HOSPITAL_COMMUNITY): Payer: Self-pay | Admitting: Emergency Medicine

## 2011-03-28 LAB — PHENYTOIN LEVEL, TOTAL: Phenytoin Lvl: 16.1 ug/mL (ref 10.0–20.0)

## 2011-03-28 MED ORDER — ALBUTEROL SULFATE (5 MG/ML) 0.5% IN NEBU: 2.5000 mg | INHALATION_SOLUTION | RESPIRATORY_TRACT | Status: DC | PRN

## 2011-03-28 MED ORDER — LORAZEPAM 2 MG/ML IJ SOLN: 0.0000 mg | Freq: Two times a day (BID) | INTRAMUSCULAR | Status: DC

## 2011-03-28 MED ORDER — SODIUM CHLORIDE 0.9 % IV SOLN
Freq: Three times a day (TID) | INTRAVENOUS | Status: DC
  Administered 2011-03-29 (×2): via INTRAVENOUS
  Filled 2011-03-28 (×8): qty 100

## 2011-03-28 MED ORDER — FOLIC ACID 1 MG PO TABS
1.0000 mg | ORAL_TABLET | Freq: Every day | ORAL | Status: DC
  Administered 2011-03-28: 1 mg via ORAL

## 2011-03-28 MED ORDER — LORAZEPAM 2 MG/ML IJ SOLN
0.0000 mg | Freq: Four times a day (QID) | INTRAMUSCULAR | Status: AC
  Administered 2011-03-28 – 2011-03-29 (×2): 2 mg via INTRAVENOUS
  Administered 2011-03-29: 1 mg via INTRAVENOUS
  Administered 2011-03-29 (×2): 2 mg via INTRAVENOUS
  Filled 2011-03-28 (×5): qty 1

## 2011-03-28 MED ORDER — ONDANSETRON HCL 4 MG/2ML IJ SOLN
4.0000 mg | Freq: Four times a day (QID) | INTRAMUSCULAR | Status: DC | PRN
  Administered 2011-03-28: 4 mg via INTRAVENOUS

## 2011-03-28 MED ORDER — NICOTINE 14 MG/24HR TD PT24
14.0000 mg | MEDICATED_PATCH | Freq: Every day | TRANSDERMAL | Status: DC
  Administered 2011-03-28 – 2011-03-30 (×3): 14 mg via TRANSDERMAL
  Filled 2011-03-28 (×4): qty 1

## 2011-03-28 MED ORDER — THIAMINE HCL 100 MG/ML IJ SOLN: 100.0000 mg | Freq: Every day | INTRAMUSCULAR | Status: DC

## 2011-03-28 MED ORDER — ACETAMINOPHEN 325 MG PO TABS
650.0000 mg | ORAL_TABLET | Freq: Four times a day (QID) | ORAL | Status: DC | PRN
  Filled 2011-03-28: qty 2

## 2011-03-28 MED ORDER — PHENYTOIN SODIUM EXTENDED 100 MG PO CAPS: 100.0000 mg | ORAL_CAPSULE | Freq: Three times a day (TID) | ORAL | Status: DC

## 2011-03-28 MED ORDER — VITAMIN B-1 100 MG PO TABS: 100.0000 mg | ORAL_TABLET | Freq: Every day | ORAL | Status: DC

## 2011-03-28 MED ORDER — IPRATROPIUM BROMIDE 0.02 % IN SOLN: 0.5000 mg | RESPIRATORY_TRACT | Status: DC | PRN

## 2011-03-28 MED ORDER — ENALAPRILAT 1.25 MG/ML IV SOLN
1.2500 mg | Freq: Four times a day (QID) | INTRAVENOUS | Status: DC | PRN
  Filled 2011-03-28: qty 1

## 2011-03-28 MED ORDER — PHENYTOIN SODIUM 50 MG/ML IJ SOLN: 100.0000 mg | Freq: Three times a day (TID) | INTRAMUSCULAR | Status: DC

## 2011-03-28 MED ORDER — PANTOPRAZOLE SODIUM 40 MG PO TBEC
40.0000 mg | DELAYED_RELEASE_TABLET | Freq: Every day | ORAL | Status: DC
  Administered 2011-03-29 – 2011-03-30 (×2): 40 mg via ORAL
  Filled 2011-03-28 (×4): qty 1

## 2011-03-28 MED ORDER — FOLIC ACID 1 MG PO TABS
1.0000 mg | ORAL_TABLET | Freq: Every day | ORAL | Status: DC
  Administered 2011-03-29 – 2011-03-30 (×2): 1 mg via ORAL
  Filled 2011-03-28 (×4): qty 1

## 2011-03-28 MED ORDER — LORAZEPAM 1 MG PO TABS: 1.0000 mg | ORAL_TABLET | Freq: Four times a day (QID) | ORAL | Status: DC | PRN

## 2011-03-28 MED ORDER — LISINOPRIL 20 MG PO TABS
20.0000 mg | ORAL_TABLET | Freq: Every day | ORAL | Status: DC
  Administered 2011-03-29 – 2011-03-30 (×2): 20 mg via ORAL
  Filled 2011-03-28 (×4): qty 1

## 2011-03-28 MED ORDER — ONDANSETRON HCL 4 MG/2ML IJ SOLN
INTRAMUSCULAR | Status: AC
  Filled 2011-03-28: qty 2

## 2011-03-28 MED ORDER — DONEPEZIL HCL 5 MG PO TABS
5.0000 mg | ORAL_TABLET | Freq: Every day | ORAL | Status: DC
  Administered 2011-03-29: 5 mg via ORAL
  Filled 2011-03-28 (×4): qty 1

## 2011-03-28 MED ORDER — PHENYTOIN SODIUM 50 MG/ML IJ SOLN
100.0000 mg | Freq: Three times a day (TID) | INTRAMUSCULAR | Status: DC
  Filled 2011-03-28: qty 2

## 2011-03-28 MED ORDER — LORAZEPAM 2 MG/ML IJ SOLN: 1.0000 mg | Freq: Four times a day (QID) | INTRAMUSCULAR | Status: DC | PRN

## 2011-03-28 MED ORDER — ACETAMINOPHEN 650 MG RE SUPP: 650.0000 mg | Freq: Four times a day (QID) | RECTAL | Status: DC | PRN

## 2011-03-28 MED ORDER — POTASSIUM CHLORIDE IN NACL 20-0.9 MEQ/L-% IV SOLN
INTRAVENOUS | Status: DC
  Administered 2011-03-28 (×3): via INTRAVENOUS
  Filled 2011-03-28 (×6): qty 1000

## 2011-03-28 MED ORDER — ASPIRIN 81 MG PO CHEW
81.0000 mg | CHEWABLE_TABLET | Freq: Every day | ORAL | Status: DC
  Administered 2011-03-29 – 2011-03-30 (×2): 81 mg via ORAL
  Filled 2011-03-28 (×4): qty 1

## 2011-03-28 MED ORDER — ADULT MULTIVITAMIN W/MINERALS CH
1.0000 | ORAL_TABLET | Freq: Every day | ORAL | Status: DC
  Administered 2011-03-29 – 2011-03-30 (×2): 1 via ORAL
  Filled 2011-03-28 (×3): qty 1

## 2011-03-28 MED ORDER — VITAMIN B-1 50 MG PO TABS
250.0000 mg | ORAL_TABLET | Freq: Every day | ORAL | Status: DC
  Administered 2011-03-29 – 2011-03-30 (×2): 250 mg via ORAL
  Filled 2011-03-28 (×3): qty 1

## 2011-03-28 MED ORDER — FERROUS SULFATE 325 (65 FE) MG PO TABS
325.0000 mg | ORAL_TABLET | Freq: Two times a day (BID) | ORAL | Status: DC
  Administered 2011-03-28 – 2011-03-30 (×4): 325 mg via ORAL
  Filled 2011-03-28 (×8): qty 1

## 2011-03-28 MED ORDER — LORAZEPAM 2 MG/ML IJ SOLN: 2.0000 mg | INTRAMUSCULAR | Status: DC | PRN

## 2011-03-28 NOTE — Progress Notes (Signed)
Subjective: Patient is doing better this a.m., had delayed responses but was appropriate.  Denies any pain.  Objective: Vital signs in last 24 hours: Filed Vitals:   03/28/11 1000 03/28/11 1030 03/28/11 1332 03/28/11 1803  BP:   101/72 129/81  Pulse: 95 115 88 99  Temp:    98.3 F (36.8 C)  TempSrc:    Oral  Resp:   14 21  SpO2: 100% 100% 96% 100%   Weight change:   Intake/Output Summary (Last 24 hours) at 03/28/11 1925 Last data filed at 03/28/11 1729  Gross per 24 hour  Intake   1000 ml  Output   3100 ml  Net  -2100 ml    Physical Exam: General: Awake, No acute distress. HEENT: EOMI. Neck: Supple CV: S1 and S2 Lungs: Clear to ascultation bilaterally Abdomen: Soft, Nontender, Nondistended, +bowel sounds. Ext: Good pulses. Trace edema.  Lab Results:  [name] 03/27/11 1900  NA 138  K 5.1  CL 97  CO2 29  GLUCOSE 143*  BUN 12  CREATININE 0.48*  CALCIUM 8.9  MG --  PHOS --   No results found for this basename: AST:2,ALT:2,ALKPHOS:2,BILITOT:2,PROT:2,ALBUMIN:2 in the last 72 hours No results found for this basename: LIPASE:2,AMYLASE:2 in the last 72 hours  Basename 03/27/11 2028  WBC 8.3  NEUTROABS --  HGB 9.9*  HCT 30.0*  MCV 85.0  PLT 254   No results found for this basename: CKTOTAL:3,CKMB:3,CKMBINDEX:3,TROPONINI:3 in the last 72 hours No components found with this basename: POCBNP:3 No results found for this basename: DDIMER:2 in the last 72 hours No results found for this basename: HGBA1C:2 in the last 72 hours No results found for this basename: CHOL:2,HDL:2,LDLCALC:2,TRIG:2,CHOLHDL:2,LDLDIRECT:2 in the last 72 hours No results found for this basename: TSH,T4TOTAL,FREET3,T3FREE,THYROIDAB in the last 72 hours No results found for this basename: VITAMINB12:2,FOLATE:2,FERRITIN:2,TIBC:2,IRON:2,RETICCTPCT:2 in the last 72 hours  Micro Results: No results found for this or any previous visit (from the past 240 hour(s)).  Studies/Results: Ct Head Wo  Contrast  03/27/2011  *RADIOLOGY REPORT*  Clinical Data: Status epilepticus.  CT HEAD WITHOUT CONTRAST  Technique:  Contiguous axial images were obtained from the base of the skull through the vertex without contrast.  Comparison: 02/23/2011  Findings: There is no acute intracranial hemorrhage, infarction, or mass lesion.  Diffuse atrophy without ventricular dilatation. Focal left inferior frontal encephalomalacia, stable.  No significant osseous abnormality.  IMPRESSION: No acute abnormalities.  Diffuse atrophy with focal left subfrontal encephalomalacia.  Original Report Authenticated By: Gwynn Burly, M.D.    Medications: I have reviewed the patient's current medications. Scheduled Meds:   . aspirin  81 mg Oral Daily  . donepezil  5 mg Oral QHS  . ferrous sulfate  325 mg Oral BID  . folic acid  1 mg Oral Daily  . folic acid  1 mg Oral Daily  . lisinopril  20 mg Oral Daily  . LORazepam  0-4 mg Intravenous Q6H   Followed by  . LORazepam  0-4 mg Intravenous Q12H  . mulitivitamin with minerals  1 tablet Oral Daily  . nicotine  14 mg Transdermal Daily  . ondansetron      . pantoprazole  40 mg Oral Q1200  . phenytoin (DILANTIN) IV  500 mg Intravenous Once  . phenytoin (DILANTIN) IV  700 mg Intravenous Once  . sodium chloride 0.9 % 100 mL with phenytoin (DILANTIN) 100 mg infusion   Intravenous Q8H  . sodium chloride  500 mL Intravenous STAT  . thiamine  100  mg Oral Daily   Or  . thiamine  100 mg Intravenous Daily  . thiamine  250 mg Oral Daily  . DISCONTD: levetiracetam  500 mg Intravenous Q12H  . DISCONTD: phenytoin  100 mg Oral TID  . DISCONTD: phenytoin (DILANTIN) IV  100 mg Intravenous Q8H  . DISCONTD: phenytoin (DILANTIN) IV  100 mg Intravenous Q8H  . DISCONTD: phenytoin (DILANTIN) IV  100 mg Intravenous Q8H  . DISCONTD: phenytoin (DILANTIN) IV  100 mg Intravenous Q8H   Continuous Infusions:   . 0.9 % NaCl with KCl 20 mEq / L 100 mL/hr at 03/28/11 1405   PRN  Meds:.acetaminophen, acetaminophen, albuterol, enalaprilat, ipratropium, LORazepam, LORazepam, LORazepam, ondansetron (ZOFRAN) IV  Assessment/Plan: 1. Seizure.  Continue Dilantin and any other antiseizure medications as per neurology.  MRI ordered of the brain to evaluate for any CVA or any structural abnormality that could be triggering seizures.  Suspect the patient has degrees of noncompliance as indicated by low Dilantin level on admission.  Keppra discontinued by neurology.  2. Alcohol abuse. On CIWA protocol and thiamine and folate.  3. HTN (hypertension).  Continue lisinopril.  4. Dementia associated with alcoholism.  Stable.  5. H/O: CVA (cardiovascular accident).  On aspirin.  6.  History of chronic dysphasia.  Will have speech therapy evaluate swallowing.  7. Tobacco abuse.  Encouraged cessation.  8. Non compliance w medication regimen.  Encourage compliance.  9.  Prophylaxis.  SCDs.  10.  Disposition.  Pending PT/OT evaluation.   LOS: 1 day  [name] A, MD 03/28/2011, 7:25 PM

## 2011-03-28 NOTE — ED Notes (Signed)
After taking meds, became nauseated, with dry heaves. Dr. Betti Cruz paged.

## 2011-03-28 NOTE — ED Notes (Signed)
Attempted to draw blood, both attempts unsuccessful.

## 2011-03-28 NOTE — ED Notes (Signed)
Applesauce and two spoons of sweet potato pie given. Refuses PO meds, difficulty swallowing, states hurts to swallow anything. Wife also has stated has difficult time swallowing, with MD in [facility] follow up.

## 2011-03-28 NOTE — ED Notes (Signed)
Talking with wife on phone

## 2011-03-28 NOTE — Consult Note (Addendum)
Reason for Consult: "seizures"  HPI: [name] [name] is an 61 y.o. male. Has a history of alcohol abuse, epilepsy, intracerebral hemorrhage, ischemic CVA, dementia and medication non-compliance. Came in with status epilpeticus. Dilantin level in ED less than 2. Patient was given a total of 1200 mg of Dilantin and started on Keppra 500 mg bid, he is on maintenance Dilantin of 100 mg IV q8h. Patient denies drug use or alcohol use prior to coming in.   Past Medical History  Diagnosis Date  . Seizure disorder 2001     EEG 07/27/10, alcohol abuse & med noncomplliance, began after traumatic subarachnoid hem  . Alcohol abuse   . CVA (cerebral infarction)   . 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 HTN (hypertension)   . GERD (gastroesophageal reflux disease)   . Pancreatitis chronic   . Lung nodule   . Esophageal ulcer without bleeding    Medications: I have reviewed the patient's current medications.  Past Surgical History  Procedure Date  . None    FH: non-cont.  Social History:  reports that he has been smoking.  He does not have any smokeless tobacco history on file. He reports that he drinks alcohol. His drug history not on file.  Allergies: No Known Allergies  ROS: as above  Blood pressure 122/74, pulse 84, temperature 98.6 F (37 C), temperature source Oral, resp. rate 17, SpO2 100.00%.  Neurological exam: AAO*1. No obvious aphasia.  Followed simple commands. Drowsy. Appeared to have some motor left sided neglect - when asked who did his left arm belong to - he said "to examiner". Cranial nerves: EOMI, PERRL. Blink to threat positive bilaterally. There was decreased activation of the left face. There was no dysarthria or palatal deviation. Motor: strength was 5/5 on right and appeared to be reduced in LUE more than LLE. Sensory: was intact throughout to light pain. Coordination: finger-to-nose intact on right, patient would not do on left. Reflexes: were 2+ in upper  extremities and 1+ at the knees and 0 at the ankles. Plantar response was mute bilaterally. Gait: deferred  Results for orders placed during the hospital encounter of 03/27/11 (from the past 48 hour(s))  BASIC METABOLIC PANEL     Status: Abnormal   Collection Time   03/27/11  7:00 PM      Component Value Range Comment   Sodium 138  135 - 145 (mEq/L)    Potassium 5.1  3.5 - 5.1 (mEq/L) MODERATE HEMOLYSIS   Chloride 97  96 - 112 (mEq/L)    CO2 29  19 - 32 (mEq/L)    Glucose, Bld 143 (*) 70 - 99 (mg/dL)    BUN 12  6 - 23 (mg/dL)    Creatinine, Ser 1.61 (*) 0.50 - 1.35 (mg/dL)    Calcium 8.9  8.4 - 10.5 (mg/dL)    GFR calc non Af Amer >90  >90 (mL/min)    GFR calc Af Amer >90  >90 (mL/min)   PHENYTOIN LEVEL, TOTAL     Status: Abnormal   Collection Time   03/27/11  7:00 PM      Component Value Range Comment   Phenytoin Lvl <2.5 (*) 10.0 - 20.0 (ug/mL)   GLUCOSE, CAPILLARY     Status: Abnormal   Collection Time   03/27/11  7:00 PM      Component Value Range Comment   Glucose-Capillary 151 (*) 70 - 99 (mg/dL)   URINE RAPID DRUG SCREEN (HOSP PERFORMED)  Status: Abnormal   Collection Time   03/27/11  8:13 PM      Component Value Range Comment   Opiates NONE DETECTED  NONE DETECTED     Cocaine NONE DETECTED  NONE DETECTED     Benzodiazepines POSITIVE (*) NONE DETECTED     Amphetamines NONE DETECTED  NONE DETECTED     Tetrahydrocannabinol NONE DETECTED  NONE DETECTED     Barbiturates NONE DETECTED  NONE DETECTED    URINALYSIS, ROUTINE W REFLEX MICROSCOPIC     Status: Abnormal   Collection Time   03/27/11  8:13 PM      Component Value Range Comment   Color, Urine YELLOW  YELLOW     APPearance CLOUDY (*) CLEAR     Specific Gravity, Urine 1.015  1.005 - 1.030     pH 6.0  5.0 - 8.0     Glucose, UA NEGATIVE  NEGATIVE (mg/dL)    Hgb urine dipstick NEGATIVE  NEGATIVE     Bilirubin Urine NEGATIVE  NEGATIVE     Ketones, ur NEGATIVE  NEGATIVE (mg/dL)    Protein, ur NEGATIVE  NEGATIVE  (mg/dL)    Urobilinogen, UA 0.2  0.0 - 1.0 (mg/dL)    Nitrite NEGATIVE  NEGATIVE     Leukocytes, UA NEGATIVE  NEGATIVE  MICROSCOPIC NOT DONE ON URINES WITH NEGATIVE PROTEIN, BLOOD, LEUKOCYTES, NITRITE, OR GLUCOSE <1000 mg/dL.  CBC     Status: Abnormal   Collection Time   03/27/11  8:28 PM      Component Value Range Comment   WBC 8.3  4.0 - 10.5 (K/uL)    RBC 3.53 (*) 4.22 - 5.81 (MIL/uL)    Hemoglobin 9.9 (*) 13.0 - 17.0 (g/dL)    HCT 40.9 (*) 81.1 - 52.0 (%)    MCV 85.0  78.0 - 100.0 (fL)    MCH 28.0  26.0 - 34.0 (pg)    MCHC 33.0  30.0 - 36.0 (g/dL)    RDW 91.4  78.2 - 95.6 (%)    Platelets 254  150 - 400 (K/uL)    Ct Head Wo Contrast  03/27/2011  *RADIOLOGY REPORT*  Clinical Data: Status epilepticus.  CT HEAD WITHOUT CONTRAST  Technique:  Contiguous axial images were obtained from the base of the skull through the vertex without contrast.  Comparison: 02/23/2011  Findings: There is no acute intracranial hemorrhage, infarction, or mass lesion.  Diffuse atrophy without ventricular dilatation. Focal left inferior frontal encephalomalacia, stable.  No significant osseous abnormality.  IMPRESSION: No acute abnormalities.  Diffuse atrophy with focal left subfrontal encephalomalacia.  Original Report Authenticated By: Gwynn Burly, M.D.   Assessment/Plan: 61 years old man with seizures in setting of medication non-compliance - reloaded with Dilantin - level pending. I stopped Keppra as it is of no additional utility. Can check albumin level to make corrections for Dilantin level. Would get MRI brain as his exam findings are not consistent with CT head, so I want to rule out a new CVA. EEG won't be done today and is of little utility. If stable today neuerologically and MRI shows no new CVA, can go home from neurological stand point.   [name] [name] 03/28/2011, 8:01 AM

## 2011-03-28 NOTE — ED Notes (Signed)
Enough blood for a CBC was collected and then the vein blew.  Pt refused to be stuck again.

## 2011-03-28 NOTE — ED Notes (Signed)
Spoke with Dr, Betti Cruz- Orders received,

## 2011-03-28 NOTE — ED Notes (Signed)
Family at bedside helping feed pudding to patient.

## 2011-03-28 NOTE — ED Notes (Signed)
[phone]  Wife-- please call with room number

## 2011-03-28 NOTE — ED Notes (Signed)
Patient refused blood draw. Pam RN made aware

## 2011-03-28 NOTE — ED Notes (Signed)
Spoke with Dr Betti Cruz regarding inability to swallow, and refusal to swallow pills, orders received. Reminded of refusal to allow lab draw, no dilantin level.

## 2011-03-28 NOTE — ED Notes (Signed)
Lab was called at 05:25 in an effort to have labs drawn. Lab states that they will be down after am collects on the floor. RN is aware. Labels at bedside

## 2011-03-29 LAB — COMPREHENSIVE METABOLIC PANEL
ALT: 17 U/L (ref 0–53)
Alkaline Phosphatase: 132 U/L — ABNORMAL HIGH (ref 39–117)
BUN: 7 mg/dL (ref 6–23)
CO2: 28 mEq/L (ref 19–32)
Chloride: 104 mEq/L (ref 96–112)
GFR calc Af Amer: >90 mL/min (ref >90)
GFR calc non Af Amer: >90 mL/min (ref >90)
Glucose, Bld: 100 mg/dL — ABNORMAL HIGH (ref 70–99)
Potassium: 2.9 mEq/L — ABNORMAL LOW (ref 3.5–5.1)
Sodium: 138 mEq/L (ref 135–145)
Total Bilirubin: 0.3 mg/dL (ref 0.3–1.2)
Total Protein: 5.2 g/dL — ABNORMAL LOW (ref 6.0–8.3)

## 2011-03-29 LAB — CBC
HCT: 33.2 % — ABNORMAL LOW (ref 39.0–52.0)
Hemoglobin: 10.7 g/dL — ABNORMAL LOW (ref 13.0–17.0)
MCV: 86 fL (ref 78.0–100.0)
RDW: 15.6 % — ABNORMAL HIGH (ref 11.5–15.5)
WBC: 7.9 10*3/uL (ref 4.0–10.5)

## 2011-03-29 MED ORDER — POTASSIUM CHLORIDE 20 MEQ/15ML (10%) PO LIQD
40.0000 meq | Freq: Once | ORAL | Status: AC
  Administered 2011-03-29: 40 meq via ORAL
  Filled 2011-03-29 (×2): qty 30

## 2011-03-29 MED ORDER — POTASSIUM CHLORIDE 20 MEQ/15ML (10%) PO LIQD
40.0000 meq | Freq: Every day | ORAL | Status: DC
  Filled 2011-03-29: qty 30

## 2011-03-29 MED ORDER — LEVETIRACETAM 500 MG PO TABS
500.0000 mg | ORAL_TABLET | Freq: Two times a day (BID) | ORAL | Status: DC
  Administered 2011-03-29 – 2011-03-30 (×2): 500 mg via ORAL
  Filled 2011-03-29 (×5): qty 1

## 2011-03-29 MED ORDER — SODIUM CHLORIDE 0.9 % IV SOLN
500.0000 mg | Freq: Two times a day (BID) | INTRAVENOUS | Status: DC
  Administered 2011-03-29: 500 mg via INTRAVENOUS
  Filled 2011-03-29 (×3): qty 5

## 2011-03-29 MED ORDER — POTASSIUM CHLORIDE CRYS ER 20 MEQ PO TBCR
40.0000 meq | EXTENDED_RELEASE_TABLET | Freq: Every day | ORAL | Status: DC
  Administered 2011-03-30: 40 meq via ORAL
  Filled 2011-03-29 (×2): qty 2

## 2011-03-29 MED ORDER — POTASSIUM CHLORIDE 20 MEQ/15ML (10%) PO LIQD
40.0000 meq | Freq: Once | ORAL | Status: AC
  Administered 2011-03-30: 40 meq via ORAL
  Filled 2011-03-29: qty 30

## 2011-03-29 MED ORDER — SODIUM CHLORIDE 0.9 % IJ SOLN
10.0000 mL | Freq: Two times a day (BID) | INTRAMUSCULAR | Status: DC
  Administered 2011-03-29: 10 mL via INTRAVENOUS

## 2011-03-29 NOTE — Clinical Documentation Improvement (Signed)
GENERIC DOCUMENTATION CLARIFICATION QUERY  THIS DOCUMENT IS NOT A PERMANENT PART OF THE MEDICAL RECORD  TO RESPOND TO THE THIS QUERY, FOLLOW THE INSTRUCTIONS BELOW:  1. If needed, update documentation for the patient's encounter via the notes activity.  2. Access this query again and click edit on the In [employer].  3. After updating, or not, click F2 to complete all highlighted (required) fields concerning your review. Select "additional documentation in the medical record" OR "no additional documentation provided".  4. Click Sign note button.  5. The deficiency will fall out of your In Basket *Please let us know if you are not able to complete this workflow by phone or e-mail (listed below).  Please update your documentation within the medical record to reflect your response to this query.                                                                                        03/29/11   Dear Dr. [facility],  In a better effort to capture your patient's severity of illness, reflect appropriate length of stay and utilization of resources, a review of the patient medical record has revealed the following indicators.    Based on your clinical judgment, please clarify and document in a progress note and/or discharge summary the clinical condition associated with the following supporting information:  In responding to this query please exercise your independent judgment.  The fact that a query is asked, does not imply that any particular answer is desired or expected.  Based on the patient's current clinical presentation please clarify:  Pt admitted with seizures  According to Pn on 03/28/11 pt with alcoholism and on CIWA protol with treatment of ativan. Please specify whether or not pt's has another diagnosis in addition to alcoholism necessitating the need for the CIWA protocol.     Possible Clinical  Conditions?  ____________________  ____________________ ____________________ _______Other Condition__________________ _______Cannot Clinically Determine   Supporting Information:  Risk Factors: Alcoholism, Seizures, Pancreatitis, Dementia associated with alcoholism Signs & Symptoms:   Treatment CIWA Protocol, Ativan  You may use possible, probable, or suspect with inpatient documentation. possible, probable, suspected diagnoses MUST be documented at the time of discharge  Reviewed:  no additional documentation provided 1/7/2013ljh  Thank You,  Enis Slipper  RN, BSN, CCDS Clinical Documentation Specialist [facility] Dept Pager: [phone] / E-mail: [email or web address]  Health Information Management [place]  03/30/2011 reassigned to Dr. Brien Few

## 2011-03-29 NOTE — Progress Notes (Signed)
CRITICAL VALUE ALERT  Critical value received:   K 2.9  Date of notification:  03/29/11  Time of notification:  0640  Critical value read back:yes  Nurse who received alert:  Js MD notified (1st page):  yes  Time of first page:  0645  MD notified (2nd page):yes  Time of second page:0650  Responding MD:  no  Time MD responded:  Not yet

## 2011-03-29 NOTE — Clinical Documentation Improvement (Signed)
MALNUTRITION DOCUMENTATION CLARIFICATION  THIS DOCUMENT IS NOT A PERMANENT PART OF THE MEDICAL RECORD  TO RESPOND TO THE THIS QUERY, FOLLOW THE INSTRUCTIONS BELOW:  1. If needed, update documentation for the patient's encounter via the notes activity.  2. Access this query again and click edit on the In [employer].  3. After updating, or not, click F2 to complete all highlighted (required) fields concerning your review. Select "additional documentation in the medical record" OR "no additional documentation provided".  4. Click Sign note button.  5. The deficiency will fall out of your In Basket *Please let us know if you are not able to complete this workflow by phone or e-mail (listed below).  Please update your documentation within the medical record to reflect your response to this query.                                                                                        03/29/11   Dear Dr. [facility],  In a better effort to capture your patient's severity of illness, reflect appropriate length of stay and utilization of resources, a review of the patient medical record has revealed the following indicators.    Based on your clinical judgment, please clarify and document in a progress note and/or discharge summary the clinical condition associated with the following supporting information:  In responding to this query please exercise your independent judgment.  The fact that a query is asked, does not imply that any particular answer is desired or expected. Possible Clinical Conditions?  Pt admitted with seizure and alcoholism,   Pt's BMI= 16.2. Please clarify whether or not BMI can be linked to one of he diagnoses listed below and document in pn  and d/c. Thank You!  [facility]: When linking BMI to a diagnosis please document both BMI and diagnosis together in pn for accuracy of SOI and ROM.   _______Moderate Malnutrition _______Severe Malnutrition     _______Protein Calorie Malnutrition _______Severe Protein Calorie Malnutrition _______Emaciation  _______Cachexia   _______Other Condition________________ _______Cannot clinically determine     Supporting Information: Risk Factors: Seizures, Alcoholism, Dementia, chronic dysphasia, H/O Pancreatitis chronic, chronic dysphasia   Signs & Symptoms: BMI= 16.2 5'9"/109 lbs    Treatment  monitoring Diet Heart DIET DYS 3  mulitivitamin with minerals folic acid ( ondansetron  thiamine    You may use possible, probable, or suspect with inpatient documentation. possible, probable, suspected diagnoses MUST be documented at the time of discharge  Reviewed:  no additional documentation provided 1/7/2013ljh   Thank You,  Enis Slipper  ,RN, BSN, CCDS Clinical Documentation Specialist [facility] Pager: [phone] / E-mail: [email or web address] Health Information Management [place]    03/30/2011 reassigned to Dr. Brien Few

## 2011-03-29 NOTE — Plan of Care (Signed)
Problem: Discharge Progression Outcomes Goal: Tolerating diet Bedside swallow assessment completed.  See report for details.  Breck [place].Ed [employer] [phone]  03/29/2011

## 2011-03-29 NOTE — Consults (Addendum)
Subjective: Patient is doing well. No significant change from yesterday.   Objective: Vital signs in last 24 hours: Temp:  [98.2 F (36.8 C)-99.2 F (37.3 C)] 98.3 F (36.8 C) (01/02 0501) Pulse Rate:  [84-115] 91  (01/02 0501) Resp:  [14-21] 16  (01/02 0501) BP: (101-129)/(72-86) 114/78 mmHg (01/02 0501) SpO2:  [94 %-100 %] 96 % (01/02 0501) Weight:  [49.7 kg (109 lb 9.1 oz)] 109 lb 9.1 oz (49.7 kg) (01/01 2014)  Intake/Output from previous day: 01/01 0701 - 01/02 0700 In: 1313.3 [P.O.:240; I.V.:1073.3] Out: 1026 [Urine:1025; Stool:1] Intake/Output this shift:   Nutritional status: Cardiac  Neurological exam: AAO*1. No obvious aphasia. Followed simple commands. Drowsy. Cranial nerves: EOMI, PERRL. Blink to threat positive bilaterally. There was decreased activation of the left face. There was no dysarthria or palatal deviation. Motor: strength was 5/5 on right and appeared to be reduced in LUE more than LLE. Sensory: was intact throughout to light pain. Coordination: finger-to-nose intact on right, patient would not do on left. Reflexes: were 2+ in upper extremities and 1+ at the knees and 0 at the ankles. Plantar response was mute bilaterally. Gait: deferred  Lab Results:  Basename 03/29/11 0454 03/27/11 2028 03/27/11 1900  WBC 7.9 8.3 --  HGB 10.7* 9.9* --  HCT 33.2* 30.0* --  PLT 281 254 --  NA 138 -- 138  K 2.9* -- 5.1  CL 104 -- 97  CO2 28 -- 29  GLUCOSE 100* -- 143*  BUN 7 -- 12  CREATININE 0.58 -- 0.48*  CALCIUM 8.3* -- 8.9  LABA1C -- -- --   Lipid Panel No results found for this basename: CHOL,TRIG,HDL,CHOLHDL,VLDL,LDLCALC in the last 72 hours  Studies/Results: Ct Head Wo Contrast  03/27/2011  *RADIOLOGY REPORT*  Clinical Data: Status epilepticus.  CT HEAD WITHOUT CONTRAST  Technique:  Contiguous axial images were obtained from the base of the skull through the vertex without contrast.  Comparison: 02/23/2011  Findings: There is no acute intracranial  hemorrhage, infarction, or mass lesion.  Diffuse atrophy without ventricular dilatation. Focal left inferior frontal encephalomalacia, stable.  No significant osseous abnormality.  IMPRESSION: No acute abnormalities.  Diffuse atrophy with focal left subfrontal encephalomalacia.  Original Report Authenticated By: Gwynn Burly, M.D.   Mr Brain Wo Contrast  03/28/2011  *RADIOLOGY REPORT*  Clinical Data: New onset seizures.  History of seizures.  Previous CVA.  Alcohol abuse and dimension.  MRI HEAD WITHOUT CONTRAST  Technique:  Multiplanar, multiecho pulse sequences of the brain and surrounding structures were obtained according to standard protocol without intravenous contrast.  Comparison: CT head without chondral 03/27/2011.  MRI brain without contrast was 04/08/2009 at [facility].  Findings: The study is severely degraded by patient motion.  The diffusion weighted images demonstrate no evidence for acute or subacute infarction.  There are remote lacunar infarcts of the basal ganglia bilaterally.  No definite hemorrhage or mass lesion is evident.  The T2 sequence is too degraded to evaluate flow in the intracranial arteries.  The paranasal sinuses appear clear.  IMPRESSION:  1.  No evidence for acute infarction. 2.  The study is severely degraded by patient motion. 3.  No definite interval change.  Original Report Authenticated By: Jamesetta Orleans. MATTERN, M.D.   Medications: I have reviewed the patient's current medications.  Assessment/Plan: 61 years old man with medications-noncompliance who comes in with seizures - no new CVA on MRI 1) Low albumin complicates use of Dilantin, stop Dilantin 2) Start Keppra 500 mg PO bid  to increase to 750 mg PO bid 3) Counseled patient on no driving, operating heavy machinery or swimming unsupervised 4) Encouraged medications compliance 5) Call with questions   LOS: 2 days   [name] [name]

## 2011-03-29 NOTE — Progress Notes (Signed)
PT/OT/ST Cancellation Note  _x__Treatment cancelled today due to medical issues with patient which prohibited therapy. Pt's K+ level is 2.9. Will defer PT evaluation at this time. Will check back another time.   ___ Treatment cancelled today due to patient receiving procedure or test   ___ Treatment cancelled today due to patient's refusal to participate   ___ Treatment cancelled today due to   Signature: Carmina Miller, PT [phone]

## 2011-03-29 NOTE — Progress Notes (Signed)
Subjective: No new issues. Alert, communicative, good oral intake, following simple commands.  Objective: Vital signs in last 24 hours: Temp:  [98.2 F (36.8 C)-99.2 F (37.3 C)] 98.7 F (37.1 C) (01/02 1410) Pulse Rate:  [80-91] 80  (01/02 1436) Resp:  [16-20] 16  (01/02 1410) BP: (111-125)/(74-88) 125/80 mmHg (01/02 1436) SpO2:  [94 %-100 %] 100 % (01/02 1410) Weight:  [49.7 kg (109 lb 9.1 oz)] 109 lb 9.1 oz (49.7 kg) (01/01 2014) Weight change:  Last BM Date: 03/28/11  Intake/Output from previous day: 01/01 0701 - 01/02 0700 In: 1313.3 [P.O.:240; I.V.:1073.3] Out: 1026 [Urine:1025; Stool:1] Total I/O In: 720 [P.O.:720] Out: 200 [Urine:200]   Physical Exam: General: Comfortable, alert, communicative, oriented, not short of breath at rest.  HEENT:  Mild clinical pallor, no jaundice, no conjunctival injection or discharge. hydration status is fair. NECK:  Supple, JVP not seen, no carotid bruits, no palpable lymphadenopathy, no palpable goiter. CHEST:  Clinically clear to auscultation, no wheezes, no crackles. HEART:  Sounds 1 and 2 heard, normal, regular, no murmurs. ABDOMEN:  Full, soft, non-tender, no palpable organomegaly, no palpable masses, normal bowel sounds. GENITALIA:  Not examined. LOWER EXTREMITIES:  No pitting edema, palpable peripheral pulses. MUSCULOSKELETAL SYSTEM:  Generalized osteoarthritic changes, otherwise, normal. CENTRAL NERVOUS SYSTEM:  No focal neurologic deficit on gross examination.  Lab Results:  [facility] 03/29/11 0454 03/27/11 2028  WBC 7.9 8.3  HGB 10.7* 9.9*  HCT 33.2* 30.0*  PLT 281 254    Basename 03/29/11 0454 03/27/11 1900  NA 138 138  K 2.9* 5.1  CL 104 97  CO2 28 29  GLUCOSE 100* 143*  BUN 7 12  CREATININE 0.58 0.48*  CALCIUM 8.3* 8.9   Recent Results (from the past 240 hour(s))  CULTURE, BLOOD (ROUTINE X 2)     Status: Normal (Preliminary result)   Collection Time   03/27/11 11:25 PM      Component Value Range Status  Comment   Specimen Description Blood   Final    Special Requests NONE   Final    Setup Time [id number]   Final    Culture     Final    Value:        BLOOD CULTURE RECEIVED NO GROWTH TO DATE CULTURE WILL BE HELD FOR 5 DAYS BEFORE ISSUING A FINAL NEGATIVE REPORT   Report Status PENDING   Incomplete      Studies/Results: Ct Head Wo Contrast  03/27/2011  *RADIOLOGY REPORT*  Clinical Data: Status epilepticus.  CT HEAD WITHOUT CONTRAST  Technique:  Contiguous axial images were obtained from the base of the skull through the vertex without contrast.  Comparison: 02/23/2011  Findings: There is no acute intracranial hemorrhage, infarction, or mass lesion.  Diffuse atrophy without ventricular dilatation. Focal left inferior frontal encephalomalacia, stable.  No significant osseous abnormality.  IMPRESSION: No acute abnormalities.  Diffuse atrophy with focal left subfrontal encephalomalacia.  Original Report Authenticated By: Gwynn Burly, M.D.   Mr Brain Wo Contrast  03/28/2011  *RADIOLOGY REPORT*  Clinical Data: New onset seizures.  History of seizures.  Previous CVA.  Alcohol abuse and dimension.  MRI HEAD WITHOUT CONTRAST  Technique:  Multiplanar, multiecho pulse sequences of the brain and surrounding structures were obtained according to standard protocol without intravenous contrast.  Comparison: CT head without chondral 03/27/2011.  MRI brain without contrast was 04/08/2009 at [facility].  Findings: The study is severely degraded by patient motion.  The diffusion weighted images demonstrate no evidence  for acute or subacute infarction.  There are remote lacunar infarcts of the basal ganglia bilaterally.  No definite hemorrhage or mass lesion is evident.  The T2 sequence is too degraded to evaluate flow in the intracranial arteries.  The paranasal sinuses appear clear.  IMPRESSION:  1.  No evidence for acute infarction. 2.  The study is severely degraded by patient motion. 3.  No definite  interval change.  Original Report Authenticated By: Jamesetta Orleans. MATTERN, M.D.    Medications: Scheduled Meds:   . aspirin  81 mg Oral Daily  . donepezil  5 mg Oral QHS  . ferrous sulfate  325 mg Oral BID  . folic acid  1 mg Oral Daily  . levETIRAcetam  500 mg Oral BID  . lisinopril  20 mg Oral Daily  . LORazepam  0-4 mg Intravenous Q6H   Followed by  . LORazepam  0-4 mg Intravenous Q12H  . mulitivitamin with minerals  1 tablet Oral Daily  . nicotine  14 mg Transdermal Daily  . pantoprazole  40 mg Oral Q1200  . potassium chloride  40 mEq Oral Once  . potassium chloride  40 mEq Oral Daily  . potassium chloride  40 mEq Oral Daily  . sodium chloride 0.9 % 100 mL with phenytoin (DILANTIN) 100 mg infusion   Intravenous Q8H  . thiamine  250 mg Oral Daily  . DISCONTD: folic acid  1 mg Oral Daily  . DISCONTD: levetiracetam  500 mg Intravenous Q12H  . DISCONTD: thiamine  100 mg Intravenous Daily  . DISCONTD: thiamine  100 mg Oral Daily   Continuous Infusions:   . 0.9 % NaCl with KCl 20 mEq / L 50 mL/hr at 03/28/11 2037   PRN Meds:.acetaminophen, acetaminophen, albuterol, enalaprilat, ipratropium, LORazepam, LORazepam, LORazepam, ondansetron (ZOFRAN) IV  Assessment/Plan:  1. Seizure. No further seizures recorded on 03/28/11 or 03/29/11. Brain  MRI of 03/27/10, is devoid of acute pathology. Have changed Keppra to oral today.  2. Alcohol abuse. On CIWA protocol and thiamine and folate. No evidence of withdrawal phenomena today. Patient appears quite lucid, and not tremulous. On appropriate vitamin supplementation. 3. HTN (hypertension). Controlled on Lisinopril.  4. Dementia associated with alcoholism. Stable.  5. H/O: CVA (cardiovascular accident). On aspirin.  6. History of chronic dysphasia. Seen by speech therapist on 03/28/10, and recommended D3 diet /Thin liquids.  7. Tobacco abuse. Encouraged cessation.  8. Non compliance with medication regimen. Encouraged compliance.    Disposition. Pending PT/OT evaluation and recommendations.     LOS: 2 days   [name] [name],[name] 03/29/2011, 6:38 PM

## 2011-03-29 NOTE — Progress Notes (Signed)
Speech Language/Pathology Clinical/Bedside Swallow Evaluation Patient Details  Name: [name] [name] MRN: [id number] DOB: May 15, 1950 Today's Date: 03/29/2011  Past Medical History:  Past Medical History  Diagnosis Date  . Seizure disorder 2001     EEG 07/27/10, alcohol abuse & med noncomplliance, began after traumatic subarachnoid hem  . Alcohol abuse   . CVA (cerebral infarction)   . 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 HTN (hypertension)   . GERD (gastroesophageal reflux disease)   . Pancreatitis chronic   . Lung nodule   . Esophageal ulcer without bleeding    Past Surgical History:  Past Surgical History  Procedure Date  . None   . No past surgeries    HPI:  61 yr old admitted with witnessed seizures.  PMH:  seizure disorder, ETOH abuse, esophageal ulcers, GERD, HTN, lung nodule.  No CXR this admit.  Progress notes with pt. complaint of "hurts to swallow anything" to  RN and difficulty/refusal to swallow pills.   Assessment/Recommendations/Treatment Plan Suspected Esophageal Findings Suspected Esophageal Findings: Belching (BELCHED CONSISTENTLY THROUGHOUT)  SLP Assessment Clinical Impression Statement: P.t exhibited mild oral dysphagia with difficulty masticating graham cracker and eggs due to combination of endentulous oral cavity and weakness from current admission/seizures.  Pt. with facial grimacing during swallow with reports of soreness in throat when swallowing.  No pharyngeal s/s aspiration exhibited.   Belching throughout assessment.  SLP suspects pain is related to possible GERD or could be  due to gastro emesis during if present during seizure?  Will downgrade diet to Dys 3 and continue thin liquid.  Discussed reflux precautions with pt. Risk for Aspiration: Mild Other Related Risk Factors: History of GERD;History of esophageal-related issues  Recommendations  Solid Consistency: Dysphagia 3 (Mechanical soft) Liquid Consistency: Thin Liquid  Administration via: Cup Medication Administration: Whole meds with liquid Supervision: Patient able to self feed Compensations: Slow rate;Small sips/bites;Follow solids with liquid Postural Changes and/or Swallow Maneuvers: Upright 30-60 min after meal Oral Care Recommendations: Oral care QID Follow up Recommendations: None  Treatment Plan Treatment Plan Recommendations: No treatment recommended at this time     Individuals Consulted Consulted and Agree with Results and Recommendations: Patient;RN  Swallow Study General  HPI: 61 yr old admitted with witnessed seizures.  PMH:  seizure disorder, ETOH abuse, esophageal ulcers, GERD, HTN, lung nodule.  No CXR this admit.  Progress notes with pt. complaint of "hurts to swallow anything" to  RN and difficulty/refusal to swallow pills. Diet Prior to this Study: Regular;Thin liquids Respiratory Status: Room air Behavior/Cognition: Alert;Cooperative Oral Cavity - Dentition: Edentulous Patient Positioning: Upright in bed Baseline Vocal Quality: Normal Volitional Cough: Strong Volitional Swallow: Able to elicit Ice chips: Not tested  Oral Motor/Sensory Function  Overall Oral Motor/Sensory Function: Appears within functional limits for tasks assessed  Consistency Results  Ice Chips Ice chips: Not tested  Thin Liquid Thin Liquid: Within functional limits Presentation: Straw;Cup;Self Fed Other Comments: FACIAL GRIMACE  Nectar Thick Liquid Nectar Thick Liquid: Not tested  Honey Thick Liquid Honey Thick Liquid: Not tested  Puree Puree: Not tested  Solid Solid: Impaired Oral Phase Impairments: Reduced lingual movement/coordination;Impaired anterior to posterior transit Oral Phase Functional Implications: Oral holding   Royce Macadamia M.Ed [employer] [phone] 03/29/2011

## 2011-03-29 NOTE — Progress Notes (Signed)
Occupational Therapy Note Note pt's decreased K level. Nsg awaiting call back from MD. Will hold off on OT eval and check back at a later time. Judithann Sauger OTR/L [phone] 03/29/2011

## 2011-03-30 LAB — COMPREHENSIVE METABOLIC PANEL
Albumin: 2.2 g/dL — ABNORMAL LOW (ref 3.5–5.2)
BUN: 13 mg/dL (ref 6–23)
Creatinine, Ser: 0.5 mg/dL (ref 0.50–1.35)
Total Bilirubin: 0.1 mg/dL — ABNORMAL LOW (ref 0.3–1.2)
Total Protein: 4.7 g/dL — ABNORMAL LOW (ref 6.0–8.3)

## 2011-03-30 LAB — CBC
HCT: 29 % — ABNORMAL LOW (ref 39.0–52.0)
MCH: 27.6 pg (ref 26.0–34.0)
MCHC: 32.1 g/dL (ref 30.0–36.0)
MCV: 86.1 fL (ref 78.0–100.0)
RDW: 15.5 % (ref 11.5–15.5)

## 2011-03-30 LAB — MAGNESIUM: Magnesium: 1.7 mg/dL (ref 1.5–2.5)

## 2011-03-30 MED ORDER — PHENYTOIN 50 MG PO CHEW
100.0000 mg | CHEWABLE_TABLET | Freq: Three times a day (TID) | ORAL | Status: DC
  Administered 2011-03-30 (×2): 100 mg via ORAL
  Filled 2011-03-30 (×6): qty 2

## 2011-03-30 NOTE — Discharge Summary (Signed)
Physician Discharge Summary  Patient ID: [name] [name] MRN: [id number] DOB/AGE: 59-24-1952 59 y.o.  Admit date: 03/27/2011 Discharge date: 03/30/2011  Primary Care Physician:  No primary provider on file.   Discharge Diagnoses:    Patient Active Problem List  Diagnoses  . Seizure  . Alcohol abuse  . HTN (hypertension)  . Leukocytosis  . Hypokalemia  . Hyperglycemia  . Encephalopathy acute  . Dementia associated with alcoholism  . H/O: CVA (cardiovascular accident)  . Tobacco abuse  . History of pancreatitis  . Dementia  . Non compliance w medication regimen    Current Discharge Medication List    CONTINUE these medications which have NOT CHANGED   Details  aspirin 81 MG tablet Take 81 mg by mouth daily.      donepezil (ARICEPT) 10 MG tablet Take 5 mg by mouth at bedtime.     ferrous sulfate 325 (65 FE) MG tablet Take 325 mg by mouth 2 (two) times daily.      folic acid (FOLVITE) 1 MG tablet Take 1 tablet (1 mg total) by mouth daily.    levETIRAcetam (KEPPRA) 100 MG/ML solution Take 15 mLs (1,500 mg total) by mouth 2 (two) times daily. Qty: 473 mL, Refills: 0    lisinopril (PRINIVIL,ZESTRIL) 20 MG tablet Take 20 mg by mouth daily.      memantine (NAMENDA) 5 MG tablet Take 5 mg by mouth daily.     omeprazole (PRILOSEC) 40 MG capsule Take 40 mg by mouth 2 (two) times daily.      ondansetron (ZOFRAN-ODT) 4 MG disintegrating tablet Take 4 mg by mouth every 8 (eight) hours as needed. For vomiting     Pancrelipase, Lip-Prot-Amyl, (CREON) 6000 UNITS CPEP Take 1 each by mouth 3 (three) times daily.     thiamine 250 MG tablet Take 250 mg by mouth daily.        STOP taking these medications     phenytoin (DILANTIN) 300 MG ER capsule          Disposition and Follow-up:  Follow up with primary MD, routinely.  Consults:  neurology  Dr Lyman Speller, Neurologist.  Significant Diagnostic Studies:  Ct Head Wo Contrast  03/27/2011  *RADIOLOGY REPORT*  Clinical Data:  Status epilepticus.  CT HEAD WITHOUT CONTRAST  Technique:  Contiguous axial images were obtained from the base of the skull through the vertex without contrast.  Comparison: 02/23/2011  Findings: There is no acute intracranial hemorrhage, infarction, or mass lesion.  Diffuse atrophy without ventricular dilatation. Focal left inferior frontal encephalomalacia, stable.  No significant osseous abnormality.  IMPRESSION: No acute abnormalities.  Diffuse atrophy with focal left subfrontal encephalomalacia.  Original Report Authenticated By: Gwynn Burly, M.D.    Brief H and P: For complete details, refer to admission H and P. However, in brief, this is a 61 year old gentleman with known history of alcohol abuse, seizures, history of CVA, intracerebral hemorrhage, dementia and medical noncompliance, presenting after a witnessed seizure at home. In the ER he had at least four more seizure episodes, which were managed with Versed, Ativan, Keppra and Dilantin. He was admitted for further evaluation, investigation and management.  Physical Exam:      On 03/30/11. General: Comfortable, alert, communicative, oriented, not short of breath at rest.  HEENT: Mild clinical pallor, no jaundice, no conjunctival injection or discharge. hydration status is fair.  NECK: Supple, JVP not seen, no carotid bruits, no palpable lymphadenopathy, no palpable goiter.  CHEST: Clinically clear to auscultation, no wheezes,  no crackles.  HEART: Sounds 1 and 2 heard, normal, regular, no murmurs.  ABDOMEN: Full, soft, non-tender, no palpable organomegaly, no palpable masses, normal bowel sounds.  GENITALIA: Not examined.  LOWER EXTREMITIES: No pitting edema, palpable peripheral pulses.  MUSCULOSKELETAL SYSTEM: Generalized osteoarthritic changes, otherwise, normal.  CENTRAL NERVOUS SYSTEM: No focal neurologic deficit on gross examination.  Hospital Course:  1. Patient presented as described above. He was placed on close observation,  with seizure precaution, and Dr Lyman Speller, neurologist, was very helpful in rationalizing anticovulsant medication.  As of 03/30/2011, no further seizures were recorded. Brain MRI of 03/27/10, is devoid of acute pathology. On  Neurologist recommendations, patient is now on monotherapy with Keppra. He will be discharged on pre-admission dose of  Keppra. 2. Alcohol abuse. Patient is a known alcoholic. He was managed with CIWA protocol, as well as thiamine and folate and by 03/29/11, no evidence of withdrawal phenomena could be elicited. Patient has been counseled appropriately.  3. HTN (hypertension). This was controlled on Lisinopril.  4. Dementia associated with alcoholism. Stable.  5. H/O: CVA (cardiovascular accident). On aspirin.  6. History of chronic dysphasia. Seen by speech therapist on 03/28/10, and recommended D3 diet /Thin liquids.  7. Tobacco abuse. Encouraged cessation.  8. Non compliance with medication regimen. Encouraged compliance.   Comment: patient was evaluated by PT/OT evaluation and no acute needs were identified. He was considered stable for discharge on 03/30/11.    Time spent on Discharge: 35 mins.  Signed: Illiana Losurdo,[name] 03/30/2011, 2:52 PM

## 2011-03-30 NOTE — Progress Notes (Signed)
03/30/11 SPOKE WITH PT CONCERNING DISCHARGE PLAN AND NEEDS.  PT STATES THAT HE HAS NO NEEDS AT PRESENT TIME. MP

## 2011-03-30 NOTE — Progress Notes (Signed)
Physical Therapy Evaluation One time eval Patient Details Name: [name] [name] MRN: [id number] DOB: 1950-05-04 Today's Date: 03/30/2011 Time: 908-930 Charge: EVI  Problem List:  Patient Active Problem List  Diagnoses  . Seizure  . Alcohol abuse  . HTN (hypertension)  . Leukocytosis  . Hypokalemia  . Hyperglycemia  . Encephalopathy acute  . Dementia associated with alcoholism  . H/O: CVA (cardiovascular accident)  . Tobacco abuse  . History of pancreatitis  . Dementia  . Non compliance w medication regimen    Past Medical History:  Past Medical History  Diagnosis Date  . Seizure disorder 2001     EEG 07/27/10, alcohol abuse & med noncomplliance, began after traumatic subarachnoid hem  . Alcohol abuse   . CVA (cerebral infarction)   . 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 HTN (hypertension)   . GERD (gastroesophageal reflux disease)   . Pancreatitis chronic   . Lung nodule   . Esophageal ulcer without bleeding    Past Surgical History:  Past Surgical History  Procedure Date  . None   . No past surgeries     PT Assessment/Plan/Recommendation PT Assessment Clinical Impression Statement: Pt did well with ambulation and stairs.  Pt appears to be at baseline.  Recommend staff ambulate in halls with pt.  No further skilled PT needs at this time. PT Recommendation/Assessment: Patent does not need any further PT services No Skilled PT: Patient at baseline level of functioning;Patient is supervision for all activity/mobility PT Recommendation Follow Up Recommendations: None Equipment Recommended: None recommended by PT PT Goals     PT Evaluation Precautions/Restrictions  Precautions Precaution Comments: Seizure Restrictions Weight Bearing Restrictions: No Prior Functioning  Home Living Lives With: Spouse Type of Home: House Additional Comments: Pt reports having stairs and 1 rail but did not remember how many. Prior Function Level of Independence:  Independent with gait;Independent with basic ADLs;Independent with transfers Cognition Cognition Arousal/Alertness: Awake/alert Overall Cognitive Status: Appears within functional limits for tasks assessed Orientation Level: Oriented to person;Disoriented to place;Disoriented to time Sensation/Coordination   Extremity Assessment RLE Assessment RLE Assessment: Within Functional Limits LLE Assessment LLE Assessment: Within Functional Limits Mobility (including Balance) Bed Mobility Bed Mobility: Yes Supine to Sit: 6: Modified independent (Device/Increase time) Sit to Supine - Left: 6: Modified independent (Device/Increase time) Transfers Transfers: Yes Sit to Stand: 5: Supervision;From bed Stand to Sit: 5: Supervision;To bed Ambulation/Gait Ambulation/Gait: Yes Ambulation/Gait Assistance: 5: Supervision Ambulation/Gait Assistance Details (indicate cue type and reason): no unsteadiness or LOB observed Ambulation Distance (Feet): 400 Feet Assistive device: None Gait Pattern: Within Functional Limits Stairs: Yes Stairs Assistance: 5: Supervision Stair Management Technique: One rail Right;Alternating pattern;Forwards Number of Stairs: 10     Exercise    End of Session PT - End of Session Activity Tolerance: Patient tolerated treatment well Patient left: in bed;with call bell in reach;with bed alarm set General Behavior During Session: Flat affect Cognition: [facility] for tasks performed  Akeylah Hendel,KATHrine E 03/30/2011, 12:09 PM Pager: [phone]

## 2011-03-30 NOTE — Progress Notes (Signed)
OT Screen:  Pt was screened for OT services.Pt feels he is at baseline and will not need any additional assistance with ADLS.  He is supervision to independent withPT.   No acute OT needs were identified.  Will sign off. Carney, OTR/L [phone] 03/30/2011

## 2011-04-03 LAB — CULTURE, BLOOD (ROUTINE X 2)
Culture  Setup Time: 201301010311
Culture: NO GROWTH

## 2011-04-08 ENCOUNTER — Inpatient Hospital Stay: Payer: Self-pay | Admitting: Internal Medicine

## 2011-04-08 LAB — COMPREHENSIVE METABOLIC PANEL
Anion Gap: 14 (ref 7–16)
BUN: 16 mg/dL (ref 7–18)
Calcium, Total: 7.6 mg/dL — ABNORMAL LOW (ref 8.5–10.1)
Chloride: 107 mmol/L (ref 98–107)
EGFR (African American): >60
EGFR (Non-African Amer.): >60
Potassium: 2.8 mmol/L — ABNORMAL LOW (ref 3.5–5.1)
SGOT(AST): 64 U/L — ABNORMAL HIGH (ref 15–37)
SGPT (ALT): 53 U/L
Total Protein: 5.3 g/dL — ABNORMAL LOW (ref 6.4–8.2)

## 2011-04-08 LAB — URINALYSIS, COMPLETE
Bacteria: NONE SEEN
Bilirubin,UR: NEGATIVE
Glucose,UR: 50 mg/dL (ref 0–75)
Ketone: NEGATIVE
Leukocyte Esterase: NEGATIVE
Nitrite: NEGATIVE
Ph: 5 (ref 4.5–8.0)
Protein: NEGATIVE
Specific Gravity: 1.029 (ref 1.003–1.030)

## 2011-04-08 LAB — CBC
MCHC: 33.4 g/dL (ref 32.0–36.0)
MCV: 86 fL (ref 80–100)
Platelet: 192 10*3/uL (ref 150–440)
RDW: 16 % — ABNORMAL HIGH (ref 11.5–14.5)
WBC: 6.4 10*3/uL (ref 3.8–10.6)

## 2011-04-08 LAB — LIPASE, BLOOD: Lipase: 186 U/L (ref 73–393)

## 2011-04-09 LAB — COMPREHENSIVE METABOLIC PANEL
Albumin: 2.3 g/dL — ABNORMAL LOW (ref 3.4–5.0)
Alkaline Phosphatase: 107 U/L (ref 50–136)
BUN: 11 mg/dL (ref 7–18)
Chloride: 110 mmol/L — ABNORMAL HIGH (ref 98–107)
Creatinine: 0.46 mg/dL — ABNORMAL LOW (ref 0.60–1.30)
Potassium: 2.9 mmol/L — ABNORMAL LOW (ref 3.5–5.1)
SGOT(AST): 57 U/L — ABNORMAL HIGH (ref 15–37)
SGPT (ALT): 54 U/L
Sodium: 146 mmol/L — ABNORMAL HIGH (ref 136–145)
Total Protein: 4.7 g/dL — ABNORMAL LOW (ref 6.4–8.2)

## 2011-04-09 LAB — CBC WITH DIFFERENTIAL/PLATELET
Basophil #: 0 10*3/uL (ref 0.0–0.1)
Eosinophil #: 0 10*3/uL (ref 0.0–0.7)
HCT: 27.8 % — ABNORMAL LOW (ref 40.0–52.0)
Lymphocyte %: 23.6 %
MCHC: 32.5 g/dL (ref 32.0–36.0)
Neutrophil #: 2.2 10*3/uL (ref 1.4–6.5)
Platelet: 147 10*3/uL — ABNORMAL LOW (ref 150–440)
RDW: 16.2 % — ABNORMAL HIGH (ref 11.5–14.5)

## 2011-04-09 LAB — MAGNESIUM: Magnesium: 1.6 mg/dL — ABNORMAL LOW

## 2011-04-10 LAB — BASIC METABOLIC PANEL
Anion Gap: 9 (ref 7–16)
BUN: 12 mg/dL (ref 7–18)
EGFR (African American): >60
EGFR (Non-African Amer.): >60
Glucose: 111 mg/dL — ABNORMAL HIGH (ref 65–99)
Osmolality: 282 (ref 275–301)
Potassium: 3.5 mmol/L (ref 3.5–5.1)

## 2011-04-10 LAB — CBC WITH DIFFERENTIAL/PLATELET
Basophil %: 0.1 %
Eosinophil %: 1.1 %
HGB: 8.8 g/dL — ABNORMAL LOW (ref 13.0–18.0)
Lymphocyte #: 0.9 10*3/uL — ABNORMAL LOW (ref 1.0–3.6)
MCH: 27.7 pg (ref 26.0–34.0)
MCHC: 32.4 g/dL (ref 32.0–36.0)
MCV: 86 fL (ref 80–100)
Monocyte #: 0.3 10*3/uL (ref 0.0–0.7)
Monocyte %: 6.3 %
Neutrophil #: 3.1 10*3/uL (ref 1.4–6.5)
Platelet: 155 10*3/uL (ref 150–440)
RBC: 3.18 10*6/uL — ABNORMAL LOW (ref 4.40–5.90)

## 2011-04-10 LAB — HEPATIC FUNCTION PANEL A (ARMC)
Albumin: 2.3 g/dL — ABNORMAL LOW (ref 3.4–5.0)
Alkaline Phosphatase: 113 U/L (ref 50–136)
Bilirubin, Direct: <0.1 mg/dL (ref 0.00–0.20)
Bilirubin,Total: 0.2 mg/dL (ref 0.2–1.0)
Total Protein: 4.8 g/dL — ABNORMAL LOW (ref 6.4–8.2)

## 2011-04-10 LAB — TSH: Thyroid Stimulating Horm: 2.93 u[IU]/mL

## 2011-04-14 LAB — CULTURE, BLOOD (SINGLE)

## 2011-04-18 ENCOUNTER — Inpatient Hospital Stay: Payer: Self-pay | Admitting: Internal Medicine

## 2011-04-18 LAB — URINALYSIS, COMPLETE
Bacteria: NONE SEEN
Bilirubin,UR: NEGATIVE
Blood: NEGATIVE
Glucose,UR: NEGATIVE mg/dL (ref 0–75)
Specific Gravity: 1.018 (ref 1.003–1.030)
Squamous Epithelial: <1

## 2011-04-18 LAB — DRUG SCREEN, URINE
Amphetamines, Ur Screen: NEGATIVE (ref ?–1000)
Barbiturates, Ur Screen: NEGATIVE (ref ?–200)
Cannabinoid 50 Ng, Ur ~~LOC~~: NEGATIVE (ref ?–50)
Cocaine Metabolite,Ur ~~LOC~~: NEGATIVE (ref ?–300)
MDMA (Ecstasy)Ur Screen: NEGATIVE (ref ?–500)
Opiate, Ur Screen: NEGATIVE (ref ?–300)
Phencyclidine (PCP) Ur S: NEGATIVE (ref ?–25)

## 2011-04-18 LAB — COMPREHENSIVE METABOLIC PANEL
Albumin: 2.8 g/dL — ABNORMAL LOW (ref 3.4–5.0)
Alkaline Phosphatase: 126 U/L (ref 50–136)
Anion Gap: 11 (ref 7–16)
Bilirubin,Total: 0.3 mg/dL (ref 0.2–1.0)
Calcium, Total: 8.2 mg/dL — ABNORMAL LOW (ref 8.5–10.1)
Creatinine: 0.63 mg/dL (ref 0.60–1.30)
Glucose: 107 mg/dL — ABNORMAL HIGH (ref 65–99)
Osmolality: 283 (ref 275–301)
Potassium: 3.1 mmol/L — ABNORMAL LOW (ref 3.5–5.1)
Sodium: 142 mmol/L (ref 136–145)
Total Protein: 5.8 g/dL — ABNORMAL LOW (ref 6.4–8.2)

## 2011-04-18 LAB — CBC WITH DIFFERENTIAL/PLATELET
Basophil #: 0 10*3/uL (ref 0.0–0.1)
Eosinophil #: 0 10*3/uL (ref 0.0–0.7)
HCT: 32.4 % — ABNORMAL LOW (ref 40.0–52.0)
Lymphocyte #: 0.8 10*3/uL — ABNORMAL LOW (ref 1.0–3.6)
MCHC: 33 g/dL (ref 32.0–36.0)
MCV: 86 fL (ref 80–100)
Monocyte %: 5.5 %
Neutrophil #: 8.8 10*3/uL — ABNORMAL HIGH (ref 1.4–6.5)
RDW: 15.7 % — ABNORMAL HIGH (ref 11.5–14.5)

## 2011-04-18 LAB — ETHANOL
Ethanol %: <0.003 % (ref 0.000–0.080)
Ethanol: <3 mg/dL

## 2011-04-19 LAB — CBC WITH DIFFERENTIAL/PLATELET
Basophil #: 0 10*3/uL (ref 0.0–0.1)
Eosinophil #: 0 10*3/uL (ref 0.0–0.7)
HCT: 33.3 % — ABNORMAL LOW (ref 40.0–52.0)
HGB: 10.9 g/dL — ABNORMAL LOW (ref 13.0–18.0)
Lymphocyte #: 1.2 10*3/uL (ref 1.0–3.6)
Monocyte #: 0.4 10*3/uL (ref 0.0–0.7)
Platelet: 171 10*3/uL (ref 150–440)
RBC: 3.86 10*6/uL — ABNORMAL LOW (ref 4.40–5.90)
WBC: 5.7 10*3/uL (ref 3.8–10.6)

## 2011-04-19 LAB — COMPREHENSIVE METABOLIC PANEL
Anion Gap: 11 (ref 7–16)
Bilirubin,Total: 0.3 mg/dL (ref 0.2–1.0)
Creatinine: 0.62 mg/dL (ref 0.60–1.30)
Glucose: 88 mg/dL (ref 65–99)
Osmolality: 284 (ref 275–301)
Potassium: 3.2 mmol/L — ABNORMAL LOW (ref 3.5–5.1)
SGOT(AST): 17 U/L (ref 15–37)
Sodium: 144 mmol/L (ref 136–145)
Total Protein: 5.2 g/dL — ABNORMAL LOW (ref 6.4–8.2)

## 2011-04-19 LAB — MAGNESIUM: Magnesium: 1.5 mg/dL — ABNORMAL LOW

## 2011-04-20 LAB — BASIC METABOLIC PANEL
BUN: 7 mg/dL (ref 7–18)
Calcium, Total: 8 mg/dL — ABNORMAL LOW (ref 8.5–10.1)
Chloride: 108 mmol/L — ABNORMAL HIGH (ref 98–107)
Co2: 23 mmol/L (ref 21–32)
Creatinine: 0.56 mg/dL — ABNORMAL LOW (ref 0.60–1.30)
EGFR (African American): >60
Glucose: 109 mg/dL — ABNORMAL HIGH (ref 65–99)

## 2011-04-24 LAB — CULTURE, BLOOD (SINGLE)

## 2011-05-01 ENCOUNTER — Emergency Department: Payer: Self-pay | Admitting: Emergency Medicine

## 2011-05-01 LAB — COMPREHENSIVE METABOLIC PANEL
Albumin: 3.5 g/dL (ref 3.4–5.0)
Alkaline Phosphatase: 110 U/L (ref 50–136)
Anion Gap: 15 (ref 7–16)
BUN: 11 mg/dL (ref 7–18)
Bilirubin,Total: 0.3 mg/dL (ref 0.2–1.0)
Bilirubin,Total: 0.5 mg/dL (ref 0.2–1.0)
Calcium, Total: 9 mg/dL (ref 8.5–10.1)
Chloride: 102 mmol/L (ref 98–107)
Creatinine: 0.78 mg/dL (ref 0.60–1.30)
Creatinine: 0.78 mg/dL (ref 0.60–1.30)
EGFR (African American): >60
EGFR (Non-African Amer.): >60
Glucose: 142 mg/dL — ABNORMAL HIGH (ref 65–99)
Potassium: 3.7 mmol/L (ref 3.5–5.1)
SGPT (ALT): 48 U/L
SGPT (ALT): 46 U/L
Sodium: 140 mmol/L (ref 136–145)
Sodium: 142 mmol/L (ref 136–145)
Total Protein: 6.8 g/dL (ref 6.4–8.2)

## 2011-05-01 LAB — CBC
HCT: 34.5 % — ABNORMAL LOW (ref 40.0–52.0)
HCT: 39.2 % — ABNORMAL LOW (ref 40.0–52.0)
HGB: 12.6 g/dL — ABNORMAL LOW (ref 13.0–18.0)
MCH: 28.2 pg (ref 26.0–34.0)
MCHC: 32.3 g/dL (ref 32.0–36.0)
MCV: 88 fL (ref 80–100)
RBC: 3.94 10*6/uL — ABNORMAL LOW (ref 4.40–5.90)
WBC: 6.7 10*3/uL (ref 3.8–10.6)

## 2011-05-01 LAB — PROTIME-INR: Prothrombin Time: 13.7 secs (ref 11.5–14.7)

## 2011-06-11 ENCOUNTER — Emergency Department: Payer: Self-pay | Admitting: Emergency Medicine

## 2011-06-11 LAB — CBC
HGB: 11.7 g/dL — ABNORMAL LOW (ref 13.0–18.0)
MCH: 27.1 pg (ref 26.0–34.0)
MCV: 86 fL (ref 80–100)
Platelet: 513 10*3/uL — ABNORMAL HIGH (ref 150–440)
RBC: 4.31 10*6/uL — ABNORMAL LOW (ref 4.40–5.90)
WBC: 15.9 10*3/uL — ABNORMAL HIGH (ref 3.8–10.6)

## 2011-06-11 LAB — URINALYSIS, COMPLETE
Bilirubin,UR: NEGATIVE
Blood: NEGATIVE
Hyaline Cast: 4
Leukocyte Esterase: NEGATIVE
Nitrite: NEGATIVE
Ph: 5 (ref 4.5–8.0)
Protein: 100
Specific Gravity: 1.034 (ref 1.003–1.030)

## 2011-06-11 LAB — COMPREHENSIVE METABOLIC PANEL
Albumin: 4.2 g/dL (ref 3.4–5.0)
Alkaline Phosphatase: 108 U/L (ref 50–136)
Anion Gap: 24 — ABNORMAL HIGH (ref 7–16)
Calcium, Total: 9 mg/dL (ref 8.5–10.1)
Co2: 17 mmol/L — ABNORMAL LOW (ref 21–32)
EGFR (African American): >60
EGFR (Non-African Amer.): >60
Glucose: 208 mg/dL — ABNORMAL HIGH (ref 65–99)
Osmolality: 285 (ref 275–301)
Potassium: 2.6 mmol/L — ABNORMAL LOW (ref 3.5–5.1)
SGOT(AST): 24 U/L (ref 15–37)
SGPT (ALT): 25 U/L
Sodium: 138 mmol/L (ref 136–145)

## 2011-06-11 LAB — TROPONIN I: Troponin-I: <0.02 ng/mL

## 2011-06-11 LAB — PHENYTOIN LEVEL, TOTAL: Dilantin: 1.1 ug/mL — ABNORMAL LOW (ref 10.0–20.0)

## 2011-06-17 LAB — CULTURE, BLOOD (SINGLE)

## 2011-07-26 DEATH — deceased

## 2012-01-16 IMAGING — CR DG CHEST 2V
2 series · 2 of 2 positions shown · non-contrast
Comparison: Chest radiograph performed 11/19/2010

CLINICAL DATA: Shortness of breath.

CHEST - 2 VIEW

[w chest lat]
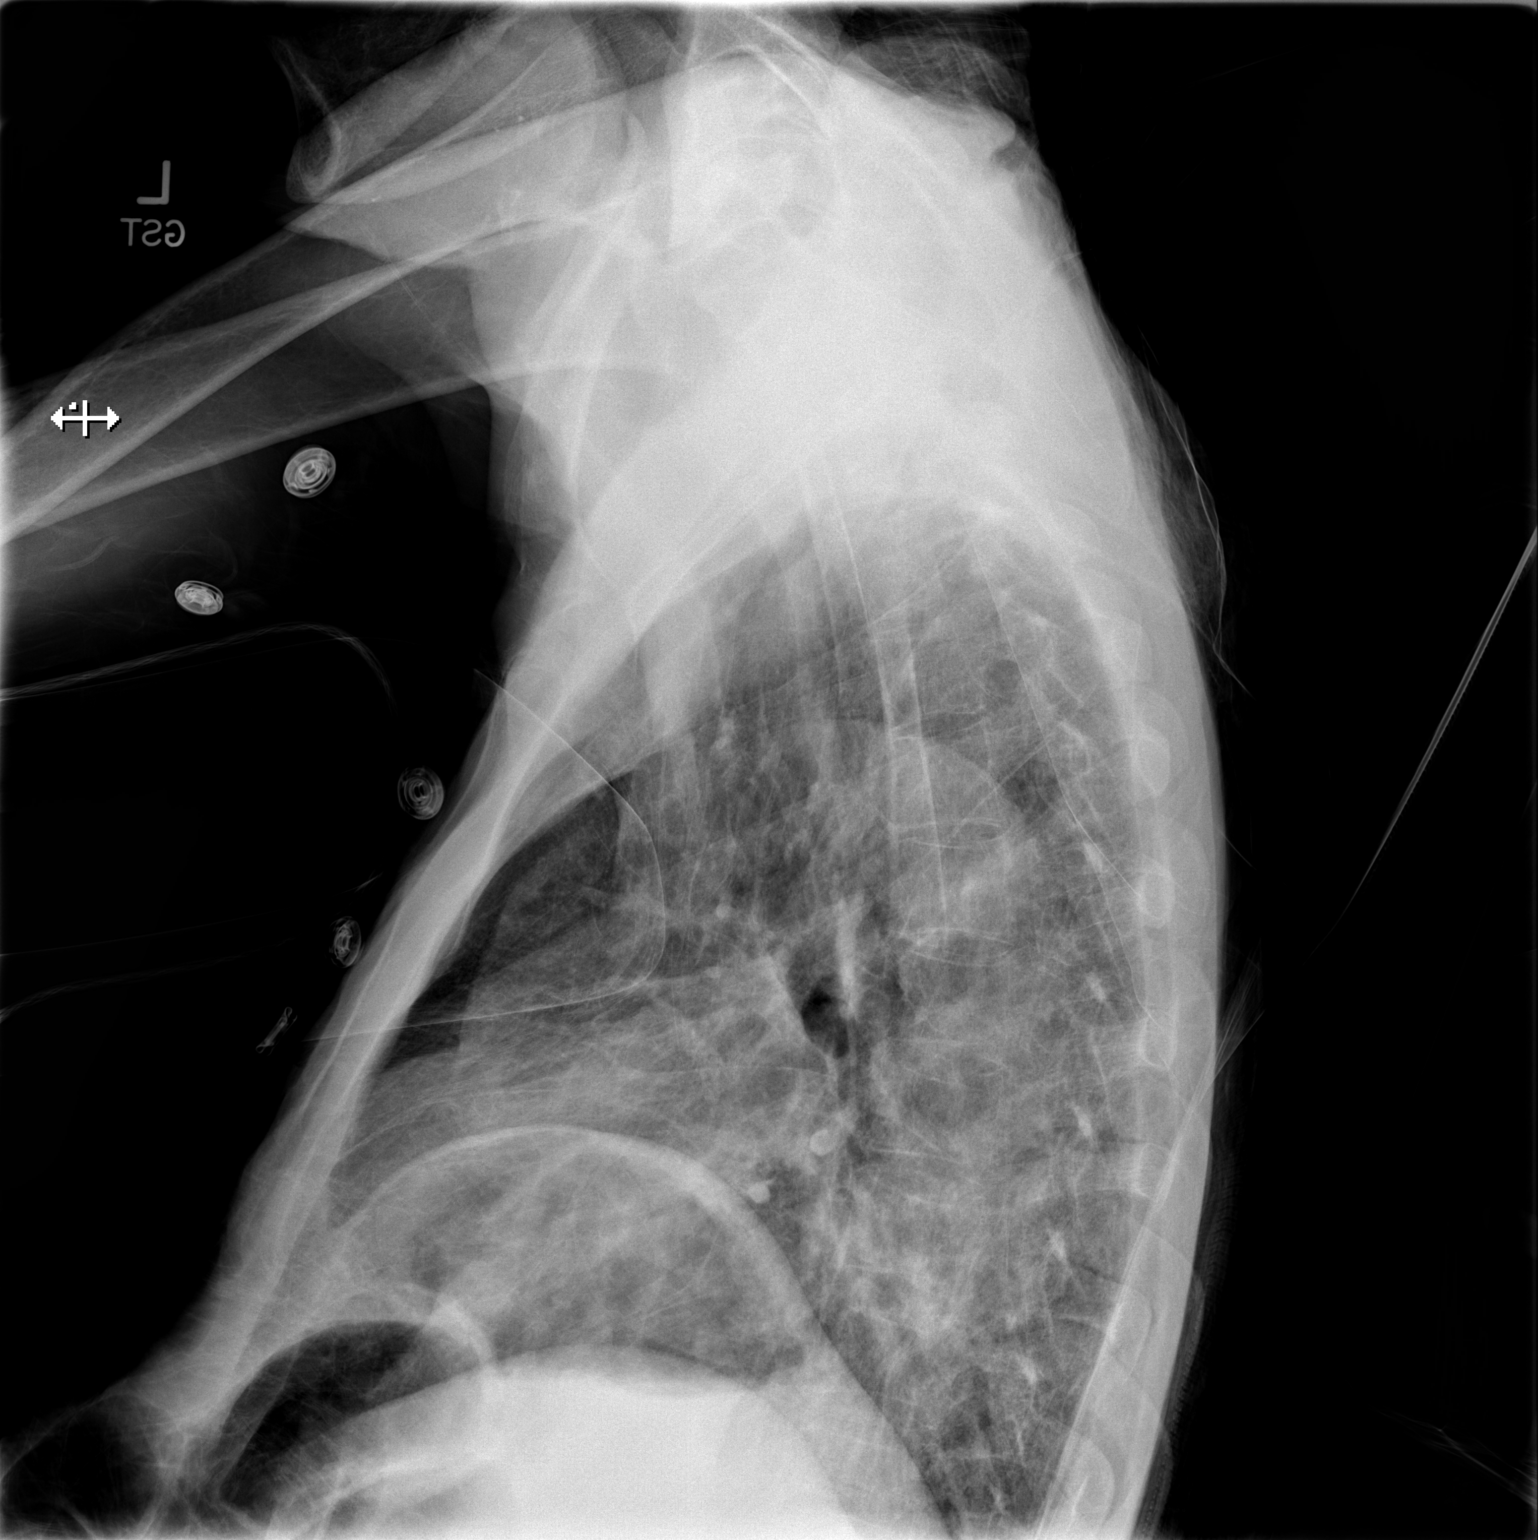

[x chest ap]
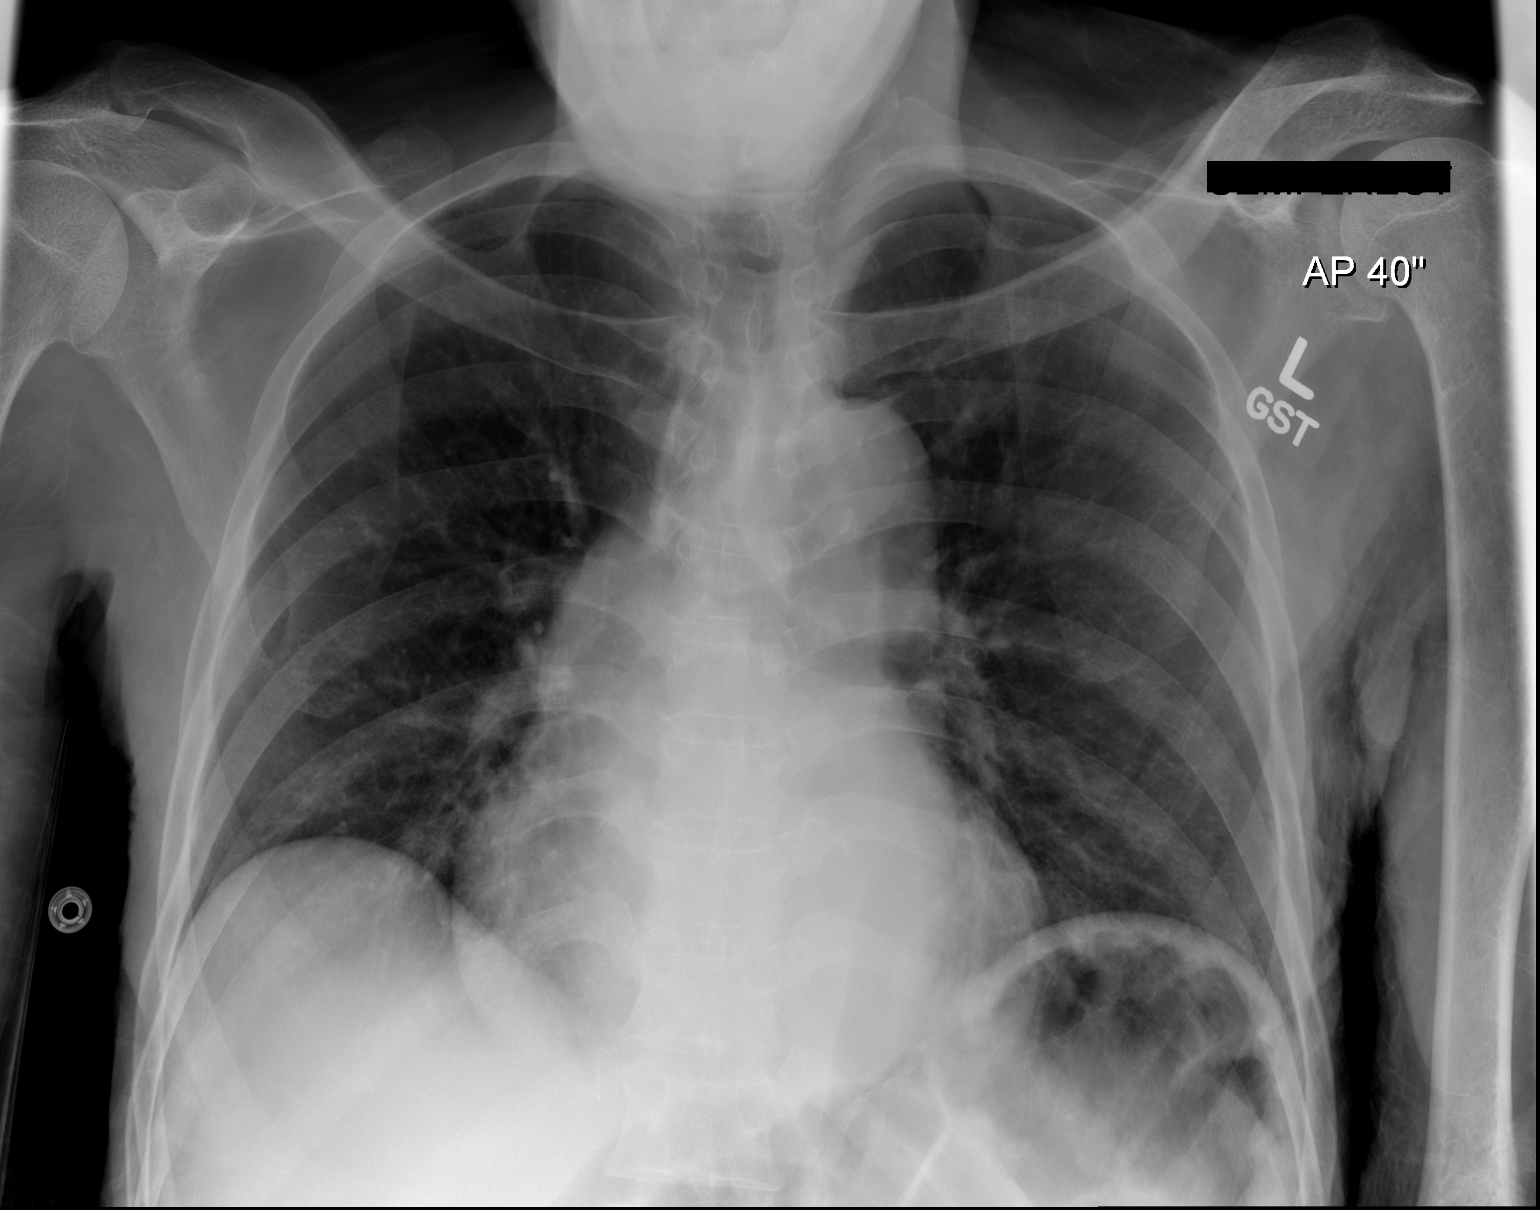

[2 of 2 positions shown; findings below may reference images not displayed]

FINDINGS: The lungs are well-aerated.  Minimal bibasilar opacities
are similar to prior studies and likely reflect atelectasis.  There
is no evidence of pleural effusion or pneumothorax.

The heart is borderline normal in size; the mediastinal contour is
within normal limits.  No acute osseous abnormalities are seen.
IMPRESSION: Minimal bibasilar opacities likely reflect atelectasis.

## 2012-02-29 IMAGING — CT CT HEAD WITHOUT CONTRAST
2 series · 16 of 30 positions shown, 20 images · non-contrast
Comparison: none

REASON FOR EXAM: seizure
COMMENTS:

PROCEDURE:     CT  - CT HEAD WITHOUT CONTRAST  - April 08, 2011  [DATE]
RESULT:     Comparison:  10/18/2010
TECHNIQUE: Multiple axial images from the foramen magnum to the vertex were
obtained without IV contrast.

[Series 2: without · axial · non-contrast · 0.39mm/px · z∈[+549,+669]mm · 13 of 30 slices shown, 17 images]
[im 3/30  brain]
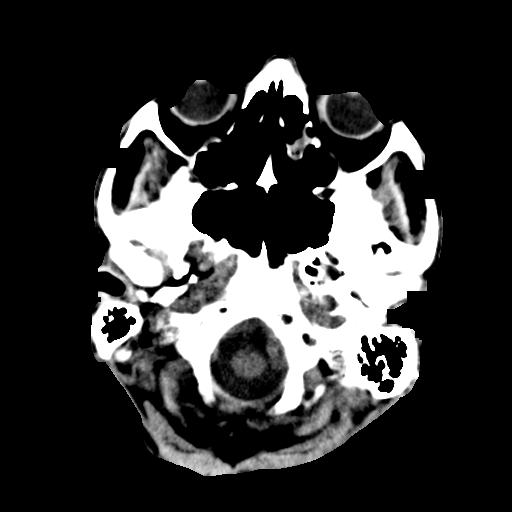
[im 3/30  bone]
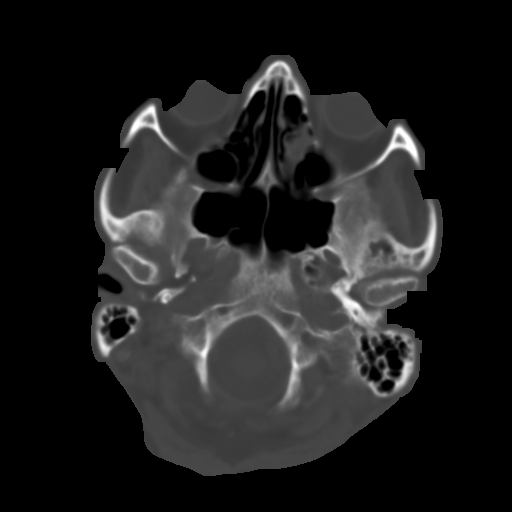
[im 5/30  brain]
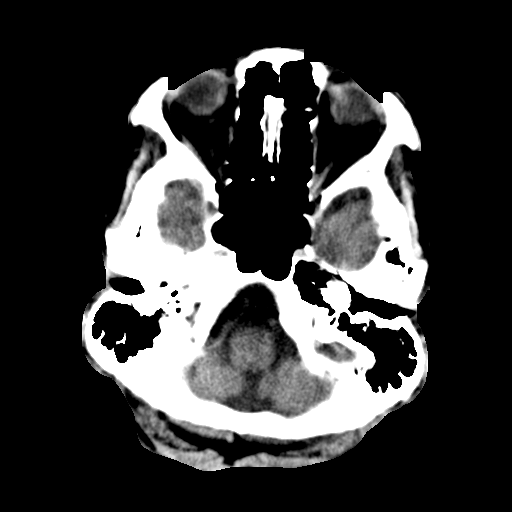
[im 7/30  brain]
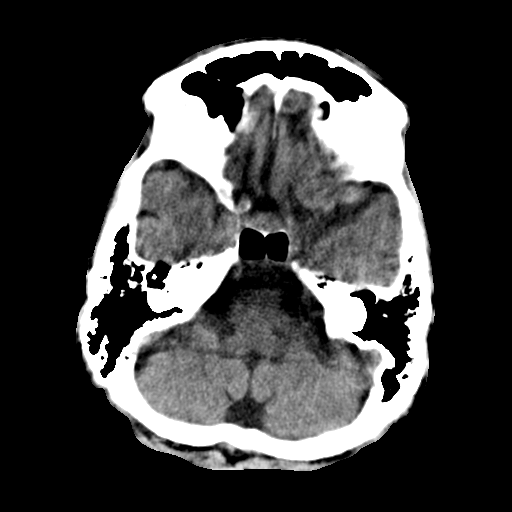
[im 9/30  brain]
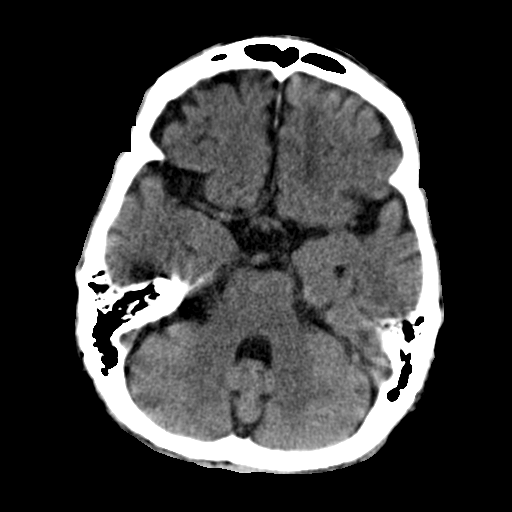
[im 11/30  brain]
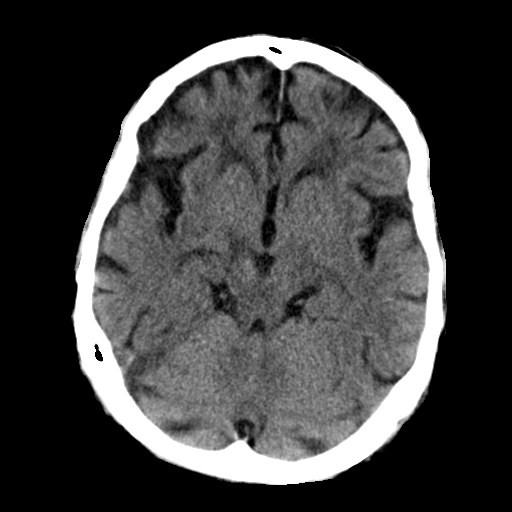
[im 11/30  bone]
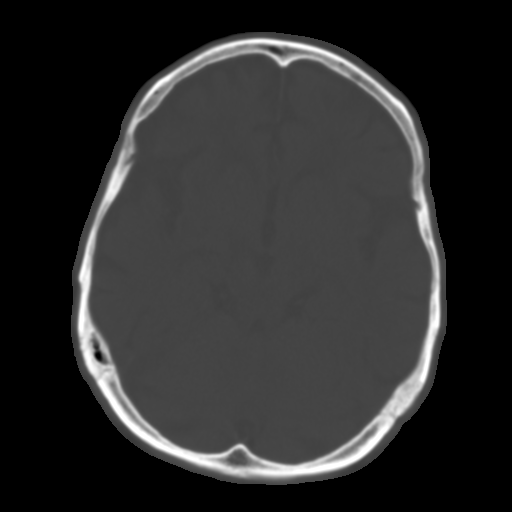
[im 13/30  brain]
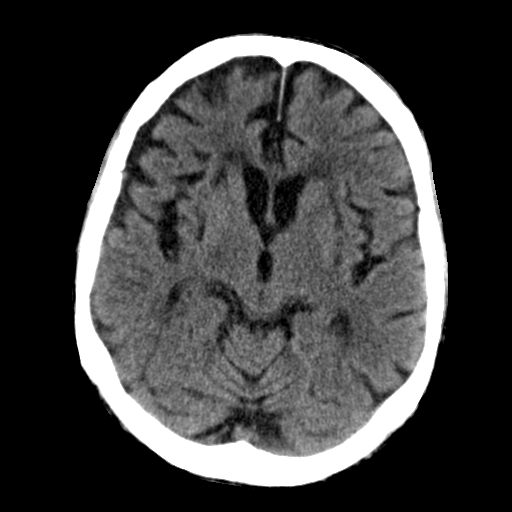
[im 15/30  brain]
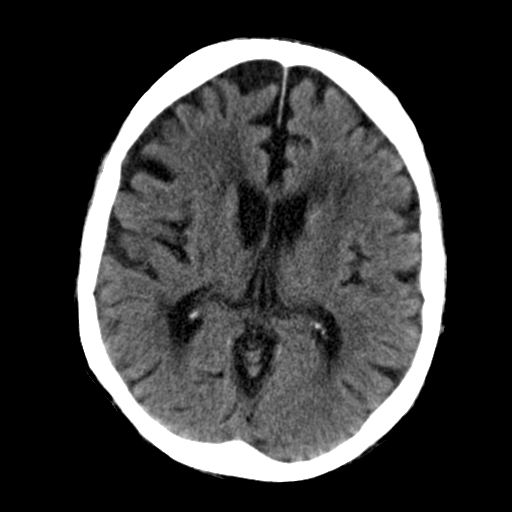
[im 17/30  brain]
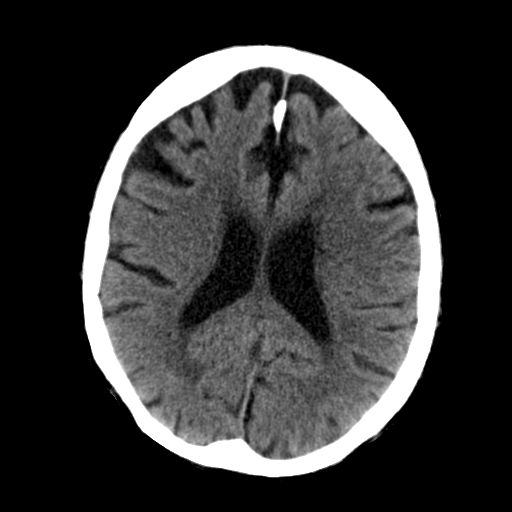
[im 19/30  brain]
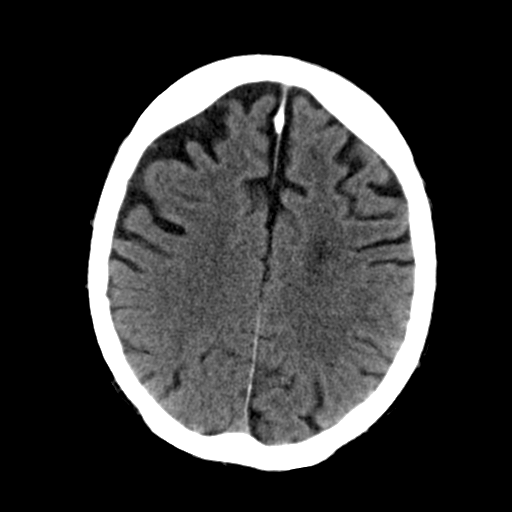
[im 19/30  bone]
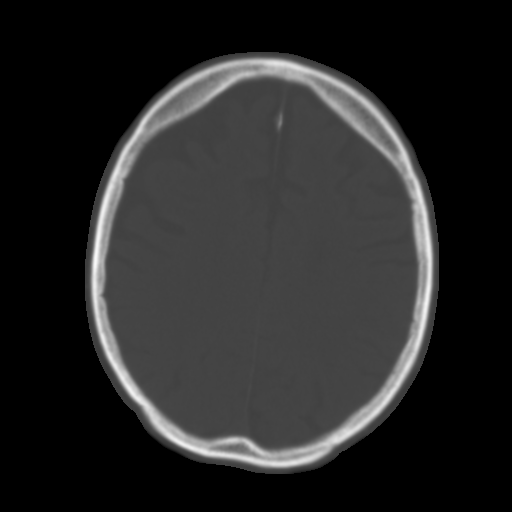
[im 21/30  brain]
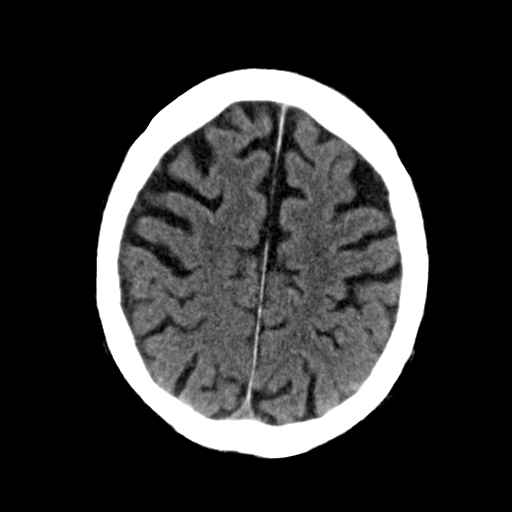
[im 23/30  brain]
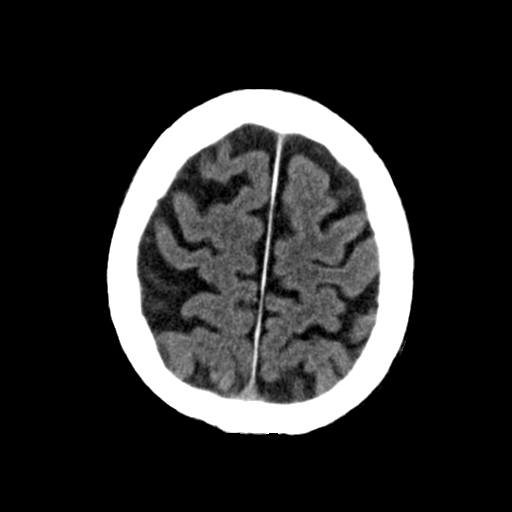
[im 25/30  brain]
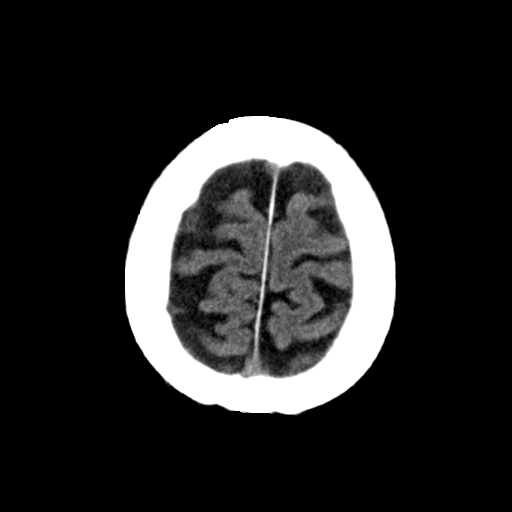
[im 27/30  brain]
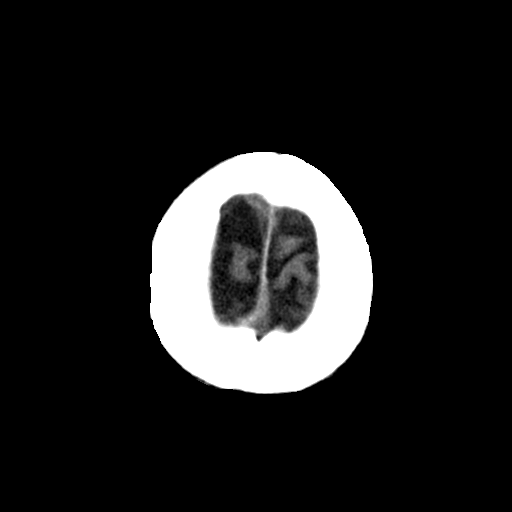
[im 27/30  bone]
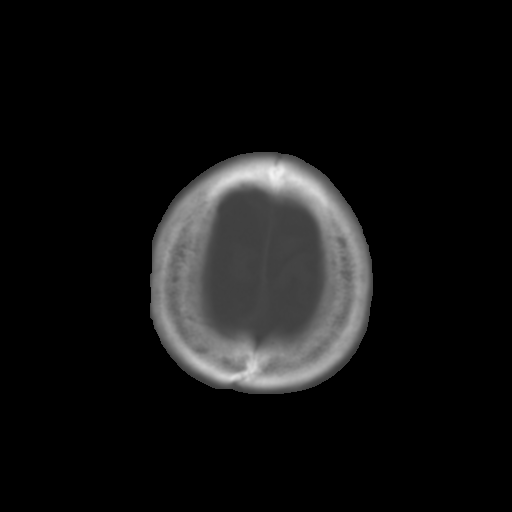

[Series 3: bone · axial · 0.39mm/px · z∈[+549,+589]mm · 3 of 30 slices shown]
[im 3/30  bone]
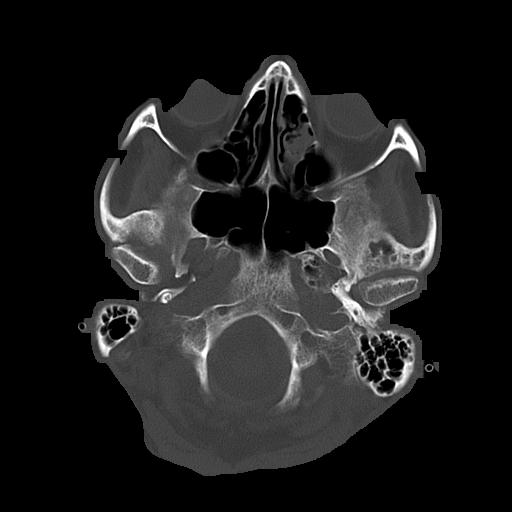
[im 7/30  bone]
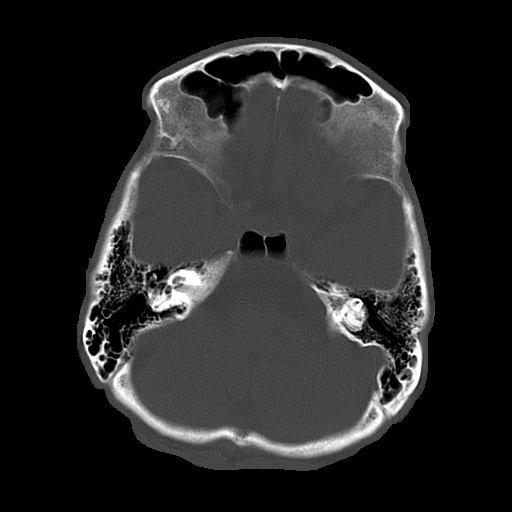
[im 11/30  bone]
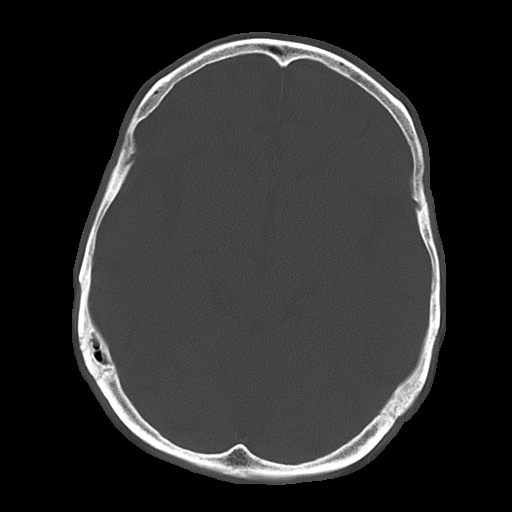

[16 of 30 positions shown; findings below may reference images not displayed]

FINDINGS: There is no evidence for mass effect, midline shift, or extra-axial fluid
collections. There is no evidence for space-occupying lesion, intracranial
hemorrhage, or cortical-based area of infarction. Periventricular and
subcortical hypoattenuation is consistent with chronic small vessel ischemic
disease. There are multiple old lacunar infarcts in the basal ganglia,
bilaterally. Mild mucosal thickening is seen in the left ethmoid air cells.

The osseous structures are unremarkable.
IMPRESSION: 1. No acute intracranial process.
2. Chronic small vessel ischemic disease.

## 2012-02-29 IMAGING — CR DG CHEST 1V PORT
1 series · 1 of 1 positions shown · non-contrast
Comparison: none

REASON FOR EXAM: seizure, fever
COMMENTS:

PROCEDURE:     DXR - DXR PORTABLE CHEST SINGLE VIEW  - April 08, 2011  [DATE]
RESULT:     Comparison: 01/27/2011

[ap]
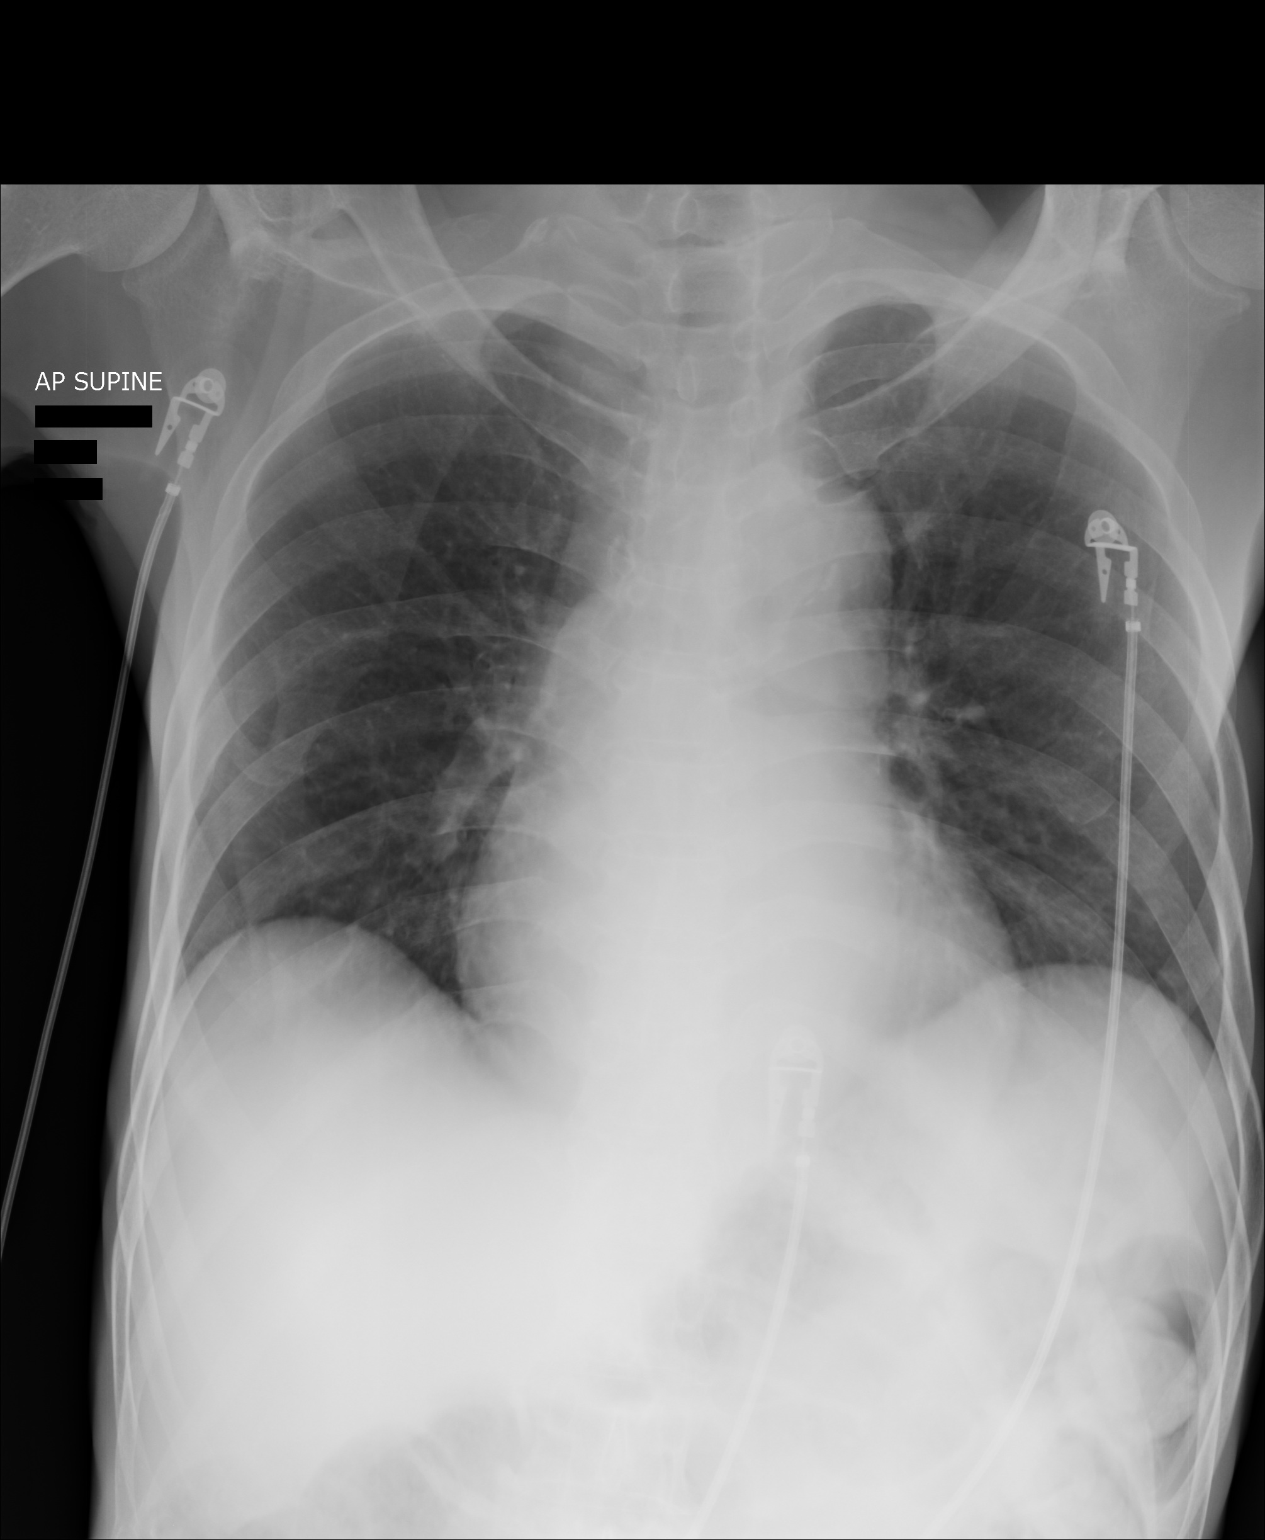

[1 of 1 positions shown; findings below may reference images not displayed]

FINDINGS: Cardiomegaly and prominent thoracic aorta are similar to prior. The lung
volumes are low. Small nodular density overlying the lower left lung is
likely secondary to a nipple shadow. Otherwise, no focal pulmonary opacities.
IMPRESSION: 1. No acute cardiopulmonary disease.
2. Small nodular density overlying the left lower lung is likely secondary
to a nipple shadow. However, followup PA and lateral radiographs with nipple
markers are recommended to exclude a true pulmonary nodule.

## 2014-07-19 NOTE — H&P (Signed)
PATIENT NAME:  [name] [name], BRANSCOME MR#:  [id number] DATE OF BIRTH:  1950/08/24  DATE OF ADMISSION:  04/18/2011  PRIMARY CARE PHYSICIAN: Dr. Benito Mccreedy   CHIEF COMPLAINT: Seizures.   HISTORY OF PRESENT ILLNESS: 64 year old male patient who was here recently. After discharge on 01/14 went home and patient supposed to take Keppra every day but according to the wife he is not taking the medication since the last discharge, especially the seizure medications. Today patient was found slumped over in the car. Patient's wife went to shop in [employer]. When she came back after 10 minutes she found him slumped over in the car. She thought he had a seizure attack so she brought him here. In the ER he had two episodes of witnessed seizures. Right now he is postictal. He received Ativan 1 mg and Keppra 1 gram in the ER and it was difficult to get his blood work initially so after that he got IJ access placed and the blood work was drawn. Patient right now is postictal and unable to get history. The history is obtained from the wife. According to the wife patient is not taking the medication as he is supposed to and also having trouble especially getting up stairs with walking. Patient has no troubles with sleeping. Also no trouble with swallowing and no episodes of weakness in either extremities which are new. This morning before this happened he was normal. Went to his cousin's house and wife picked him up from there and went shopping. According to the wife he was a heavy drinker but not drinking recently.   PAST MEDICAL HISTORY:  1. Seizures.  2. History of alcohol abuse. 3. Chronic pancreatitis. 4. History of reflux esophagitis. 5. Portal hypertensive gastropathy. 6. Chronic obstructive pulmonary disease.   SOCIAL HISTORY: He still smokes but no drinking at this time according to the wife and no drugs.   FAMILY HISTORY: Mother had history of cancer.   MEDICATIONS:  1. Aricept 10 mg  daily. 2. Creon 12,000 units 2 capsules 3 times daily. 3. Lasix 20 mg daily. 4. Keppra 1 gram b.i.d.  5. Lisinopril 5 mg daily. 6. Namenda 5 mg daily. 7. Norvasc 5 mg daily. 8. Protonix 40 mg b.i.d.  9. Ferrous sulfate 325 mg b.i.d.according to the wife he misses some of the medicines, especially he is not taking seizure medications as he is supposed to at home.   REVIEW OF SYSTEMS: Right now he is just postictal but arousable but unable to give any complaints.   PHYSICAL EXAMINATION:  VITAL SIGNS: Temperature 98, pulse 85, respirations 18, blood pressure 169/89, saturations 97% on room air.   GENERAL: Patient postictal.  HEENT: Head atraumatic, normocephalic. Pupils are equally reacting to light. Extraocular movements are intact. ENT: No tympanic membrane congestion. No turbinate hypertrophy. No oropharyngeal erythema.   NECK: Normal range of motion. No lymphadenopathy. No JVD. No carotid bruit.   CARDIOVASCULAR: S1 and S2 regular. No murmurs.   LUNGS: Bilaterally clear to auscultation. No wheeze. No rales.   ABDOMEN: Soft, nontender, nondistended. Bowel sounds present. No organomegaly. No hernias.   EXTREMITIES: No extremity edema. Pedal pulses are intact. No cyanosis. No clubbing.   NEUROLOGIC: Patient has been postictal but no obvious focal deficit is observed. He has left hemiparesis because of the old strokes.   PSYCH: Right now he is postictal.   LABORATORY, DIAGNOSTIC AND RADIOLOGICAL DATA: Lab data showed WBC 10.3, hemoglobin 10.7, hematocrit 32.4, platelets 220.   Electrolytes: Sodium 142, potassium  3.1, chloride 106, bicarbonate 25, BUN 10, creatinine 0.63, glucose 107. Liver functions within normal limits. Alcohol level less than 3.   ASSESSMENT AND PLAN: This is a 64 year old male with:  1. Uncontrolled seizures secondary to noncompliance. Patient had extensive work-up including MRA and EEG during the last admission which was less than two weeks ago. 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 Patient had mild cerebral atrophy. Patient also had EEG done that showed epileptic discharges in the right temporal region and focal slowing was seen. Patient will be on Keppra 1 gram IV b.i.d. and will use Ativan p.r.n. for breakthrough seizures and will get neurology evaluation to see if we can change to other antiseizure medications which he will take if possible and also will put him on seizure precautions, fall precautions and complete bedrest till he is more awake and he will be n.p.o. till he is awake and then will start him on diet.  2. Hypokalemia due to Lasix use. Replace the potassium and hold the Lasix and use Norvasc for blood pressure.  3. Patient has chronic pancreatitis. Continue Creon when he is more awake and alert when the diet is started. 4. Alcohol liver disease. Right now he is not drinking. We will use CIWA protocol if needed.   5. History of anemia with severe esophagitis. He is on PPIs and iron, continue them. 6. Dementia. Continue Aricept and Namenda.  7. Patient has still been smoking so will counsel when he is more awake and alert after the postictal state resolves. Keppra levels are already done. Will follow them and also get the blood work including blood cultures and chest x-ray.   TIME SPENT ON HISTORY AND PHYSICAL: 60 minutes.   Discussed the plan with patient's wife. She wants to see if he can be placed in a nursing home because she is not able to care for him with recurrent admissions and multiple medical problems.   CODE STATUS: FULL CODE.   ____________________________ Katha HammingSnehalatha Dim Meisinger, MD sk:cms D: 04/18/2011 17:45:21 ET T: 04/19/2011 05:36:43 ET JOB#: [id number]  cc: Katha HammingSnehalatha Ranada Vigorito, MD, <Dictator> Glenetta BorgMaria-Dorina Sevilla, MD Katha HammingSNEHALATHA Ahmadou Bolz MD ELECTRONICALLY SIGNED 05/15/2011 16:18

## 2014-07-19 NOTE — Discharge Summary (Signed)
PATIENT NAME:  [name] [name], Laquon MR#:  [id number] DATE OF BIRTH:  [birth year]  DATE OF ADMISSION:  04/18/2011 DATE OF DISCHARGE:  04/24/2011  PRIMARY CARE PHYSICIAN: Glenetta BorgMaria-Dorina Sevilla, MD  PRESENTING COMPLAINT: Breakthrough seizures.   DISCHARGE DIAGNOSES:  1. Breakthrough seizures secondary to medical noncompliance, now stable.  2. Hypertension.  3. Dementia.  4. Chronic pancreatitis.  5. History of alcohol abuse.  6. Electrolyte abnormality with hypokalemia and hypomagnesemia, resolved.   DISPOSITION: The patient is being discharged [facility].   CODE STATUS: FULL CODE.  DISCHARGE MEDICATIONS:  1. Keppra 1000 mg p.o. twice a day. 2. Folic acid 1 mg p.o. daily.  3. Namenda 5 mg daily.  4. Thiamine 100 mg daily.  5. Norvasc 10 mg p.o. daily.  6. Lisinopril 5 mg daily. 7. Tylenol 650 mg every 4 hours p.r.n.  8. Ferrous sulfate 325 mg p.o. twice a day.  9. Pancrelipase 12,000 units delayed extended-release 2 capsules oral three times daily. 10. Aricept 10 mg at bedtime.  11. Protonix 40 mg p.o. daily.   DIET:  2 gram sodium diet.   ACTIVITY: Physical therapy.   DISCHARGE LABS/STUDIES: Glucose 109, BUN 7, creatinine 0.56, sodium 143, potassium 3.6, chloride 108, bicarbonate 23, calcium 8.0. Magnesium 2.1. Hemoglobin and hematocrit 10.9 and 33.3, white count 5.7.   Blood culture is negative in 48 hours.   Urinalysis is negative for urinary tract infection.   Urine drug screen is negative.  Serum levetiracetam levels were 31.1. Serum ethanol on admission was 0.003.   CONSULTANTS: 1. Suzan SlickPeter Clarke, MD - Neurology (curb side). 2. Physical Therapy.   BRIEF SUMMARY OF HOSPITAL COURSE: [name] [name] is a 64 year old African American gentleman with past medical history of seizure disorder and dementia who came in with:  1. Breakthrough seizures with epilepsy which appeared to be secondary to noncompliance: As per his wife, the patient was not taking his  Keppra at home. He had recently had work-up with MRI and EEG done. EEG did suggest epileptic spikes in the posterior temporal area. He was initially loaded with IV dose of Keppra for four doses after curb-siding Dr. Suzan SlickPeter Clarke on the telephone in consultation and now on home dose of 1000 mg twice a day. The patient did not have any more seizures while in-house. Seizure precautions were carried out.  2. Hypertension: On Norvasc and will resume lisinopril, home dose.  3. Dementia: On Aricept and Namenda.  4. Chronic pancreatitis: On pancreatic enzyme supplementation.  5. History of alcohol abuse: The patient was kept on his MVI and thiamine.  6. Hypokalemia/hypomagnesemia: These were repleted.          7. Physical therapy saw the patient and the patient will be [facility] today. His level II PASSAR was obtained.   His hospital stay remained stable. The patient is a FULL CODE.  ____________________________ Wylie HailSona A. Allena KatzPatel, MD sap:slb D: 04/24/2011 09:15:10 ET T: 04/24/2011 09:27:42 ET JOB#: [id number]  cc: Paris Hohn A. Allena KatzPatel, MD, <Dictator> Glenetta BorgMaria-Dorina Sevilla, MD Willow OraSONA A Kuzey Ogata MD ELECTRONICALLY SIGNED 04/29/2011 15:40

## 2014-07-19 NOTE — Discharge Summary (Signed)
PATIENT NAME:  [name] [name], [name] [name] MR#:  [id number] DATE OF BIRTH:  [birth year]  DATE OF ADMISSION:  04/08/2011 DATE OF DISCHARGE:  04/10/2011  ADMITTING PHYSICIAN: Katha HammingSnehalatha Konidena, MD    DISCHARGE DIAGNOSES: [name] Pawling A. Lafayette DragonFirozvi, MD   PRIMARY CARE PHYSICIAN: Glenetta BorgMaria-Dorina Sevilla, MD   ADMITTING DIAGNOSIS: Seizures.   DISCHARGE DIAGNOSES:  1. Seizures due to noncompliance. 2. Alcohol abuse.  3. Hypokalemia.  4. Hypertension.  5. Pancytopenia from alcohol abuse. 6. Alcoholic liver disease. 7. History of recent anemia.  8. Hyperglycemia.  9. Stress reaction.   CONSULTANTS:  1. Case Management.   2. PT/OT.  HOSPITAL LABORATORY, DIAGNOSTIC AND RADIOLOGICAL DATA:  Chest x-ray on 04/08/2011 showed no acute cardiopulmonary disease. Small nodular density overlying the left lung is likely secondary to nipple shadow.  Head CT without contrast on 04/08/2011 showed no acute intracranial process. Chronic small vessel ischemic disease.  MRI of the brain on 04/08/2010: Evaluation limited due to patient motion. No acute infarct. 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  EEG on 04/10/2011 showed subclinical seizures should be considered as a diagnostic possibility.  EEG findings support possible seizure focus from right posterior temporal head region.  HOSPITAL COURSE: Initial History and Physical were done by Dr. Luberta MutterKonidena. Please refer to her note dated 04/08/2011 for complete details. Briefly, the patient is an 64 year old African American male with a history of seizure disorder, alcohol abuse, pancreatitis, noncompliance and smoking who presents with confusion, possible seizure as his right hand was shaking. He was admitted to the Hospitalist Service.   1. Seizure: This could be due to medical noncompliance versus alcohol withdrawal. The patient was kept on Keppra. He received Ativan initially. I discussed with Neurology. We did an EEG and MRI as noted above. The patient was explained  about compliance as well as family. He had no episodes once being started back on Keppra. He will follow up with his primary care physician as an outpatient.   2. Hypokalemia due to Lasix use: We replaced potassium, held Lasix. Hypokalemia was repleted.  3. Hypertension: The patient was on Norvasc, and we increased the dose.  4. Chronic pancreatitis: On Creon with normal lipase.  5. Alcoholic liver disease: This was stable.   6. Pancytopenia: This was due to his alcohol abuse.  7. Alcohol abuse: The patient was put on CIWA  protocol .  8. Hyperglycemia: This was stress reaction.  9. History of recent anemia with severe erosive esophagitis: The patient is on PPI and iron.  10. Dementia: The patient is on Aricept and Namenda and [facility] on 04/11/2011.   11. Physical therapy: The patient was evaluated by PT and recommended for discharge home.  12. Smoking: The patient was counseled about cessation for over three minutes as well as alcohol abuse he was counseled.  13. Deep venous thrombosis prophylaxis: Maintained with TED stockings and SCDs.   The patient was discharged on 04/10/2011.   DISCHARGE PHYSICAL EXAMINATION: VITAL SIGNS: Temperature 97.5, heart rate 62, respiratory rate 18, blood pressure 152/75, saturating 100% on room air. LUNGS: Clear to auscultation. CARDIOVASCULAR: Regular rate and rhythm.   DISCHARGE MEDICATIONS:  1. Aricept 1 tablet at bedtime.  2. Namenda 5 mg in the morning. 3. Norvasc 5 mg in the morning.  4. Creon 12,000 international units, 2 capsules t.i.d. with meals.  5. Lisinopril 5 mg daily.  6. Keppra 1 gram p.o. b.i.d.  7. Protonix 40 mg daily.  8. Ferrous sulfate 325 mg daily.  9. Colace 100 mg p.o. b.i.d.  10. Folic acid 1 mg daily x14 days.  11. Thiamine 100 mg daily x 14days.   OXYGEN: None.   DIET: Low-sodium diet.   ACTIVITY: As tolerated. No driving.   FOLLOWUP:  1. The patient is to see Dr. Glenetta Borg in  one week.   2. The patient is to see [facility] on 04/11/2011.   CODE STATUS:  FULL CODE.     TOTAL TIME SPENT ON DISCHARGE: 50 minutes.   Thank you for allowing me to participate in the care of this patient.  ____________________________ Corie Chiquito. Lafayette Dragon, MD aaf:cbb D: 04/12/2011 10:53:27 ET T: 04/12/2011 12:37:15 ET JOB#: [id number]  cc: Karolee Ohs A. Lafayette Dragon, MD, <Dictator> Glenetta Borg, MD [facility] Karolee Ohs Laverda Page MD ELECTRONICALLY SIGNED 04/12/2011 15:42

## 2014-07-19 NOTE — H&P (Signed)
PATIENT NAME:  [name] [name], [name] [name] MR#:  [id number] DATE OF BIRTH:  Sep 24, 1950  DATE OF ADMISSION:  04/08/2011   PRIMARY CARE PHYSICIAN: Benito Mccreedy, MD   CHIEF COMPLAINT: Seizures.   HISTORY OF PRESENT ILLNESS: The patient is a 64 year old male patient who was here recently in December came in because of an episode of confusion at home. The patient was doing okay this morning. In the evening after he ate his dinner he went upstairs and wife noticed that his right hand was shaking and he was disoriented and she called EMS. He had an episode of witnessed seizure in the ER and received Ativan. According to the wife, he is not taking his medications as he should for the past one week and he has been noncompliant. The patient right now is able to answers questions and denies any complaints and asking for food. His last drink  was a week ago according to the wife. He is not going out much but this morning he went out and had one cigar.   PAST MEDICAL HISTORY:  1. History of seizure disorder. 2. History of alcohol abuse. 3. Chronic pancreatitis. 4. Chronic obstructive pulmonary disease. 5. History of acute on chronic anemia with recent EGD November 6th and found to have reflux esophagitis and also mild portal hypertensive gastropathy.   SOCIAL HISTORY: Still smoking. Still drinking alcohol. Last drink was a week ago. No drugs.   FAMILY HISTORY: Mother had history of cancer.   MEDICATIONS:  1. Aricept 10 mg daily. 2. Creon 12,000 units p.o. 2 capsules 3 times daily with meals. 3. Lasix 20 mg p.o. daily. 4. Keppra 1 gram b.i.d.  5. Lisinopril 5 mg p.o. daily.  6. Namenda 5 mg p.o. daily.  7. Norvasc 5 mg p.o. daily.  8. He is supposed to be on Protonix 40 mg p.o. b.i.d. which was given during the last discharge.  9. Also he is supposed to be on ferrous sulfate 325 mg p.o. b.i.d.   REVIEW OF SYSTEMS: CONSTITUTIONAL: He feels somewhat fatigued. EYES: No blurred vision. ENT: No tinnitus. No ear  pain. No epistaxis. No difficulty swallowing. RESPIRATORY: No cough. CARDIOVASCULAR: No chest pain. No orthopnea. GI: No nausea. No vomiting. No abdominal pain. The patient has a history of chronic pancreatitis and also grade D reflux esophagitis. GU: No dysuria. ENDOCRINE: No polyuria or nocturia. INTEGUMENTARY: No skin rashes. MUSCULOSKELETAL: No joint pain. NEUROLOGIC: No numbness. No weakness. The patient has seizure disorder due to alcohol use and not taking meds as given as he should, . PSYCH: Has history of dementia.    PHYSICAL EXAMINATION:   GENERAL: The patient is a 64 year old male appearing cachectic and answering questions appropriately.  VITAL SIGNS: Temperature 100 degrees Fahrenheit initially; repeat 97.3, pulse 87, respirations 18, blood pressure 171/93. Repeat blood pressure 151/84, pulse 75, respirations 22, sat 100% on room air. Awake, alert, oriented, answering questions appropriately.   HEENT: Head atraumatic, normocephalic. Pupils are equal and reactive to light. Extraocular movements are intact.   ENT: No tympanic membrane congestion. No turbinate hypertrophy. No oropharyngeal erythema.   NECK: Normal range of motion. No JVD. No carotid bruit.   CARDIOVASCULAR: S1, S2 regular. No murmurs.   LUNGS: Bilaterally clear to auscultation. No wheeze. No rales.   ABDOMEN: Soft, nontender, nondistended. Bowel sounds present.   EXTREMITIES: No extremity edema. No cyanosis. No clubbing.   NEUROLOGIC: The patient has no focal neurological deficit but slow to respond. Speech is clear.   PSYCH: Mood and affect  are normal.   LABORATORY, DIAGNOSTIC, AND RADIOLOGICAL DATA: CT of the head showed no mass effect. No midline shift. No extra-axial fluid collection. No evidence of space-occupying lesion. No intracranial hemorrhage or cortical-based area of infarct. 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   ABG pH  7.39, pCO2 31, pO2 266, sats 100%. WBC 6.4, hemoglobin 10, hematocrit 30, platelet count 192. Electrolytes sodium 144, potassium 2.8, chloride 107, bicarb 23, BUN 16, creatinine 0.82, glucose 189. Lipase 186. Troponin less than 0.02. Alcohol less than 3.   EKG showed normal sinus at 87 beats per minute. The patient has T wave inversions in lead V3 and V4, rate of 87 beats per minute. Chest x-ray within normal limits.   ASSESSMENT AND PLAN:  1. This is a 64 year old male with seizures. The patient has been noncompliant with medications. Will get Keppra level and continue Keppra 1 gram IV b.i.d. Also, use Ativan p.r.n. for breakthrough seizures. Obtain EEG. If seizures persist, get Neurology consult. Will get MRI of the brain as well.  2. Hypokalemia. The patient might be having a seizure due to  hypokalemia as well. Replace the potassium. The patient has already been started on half normal saline at 40 mEq potassium at 200 mL/h.as ordered by er MD, Adjust IV fluids and recheck the potassium in the morning.  3. History of EtOH abuse. The patient did not drink in one week. Continue thiamine, folic acid, and CIWA protocol just to make sure. Consult the patient against persistent smoking and drinking.  4. History of grade D reflux esophagitis on recent EGD on November 6th last year. Continue Protonix 40 mg p.o. b.i.d. along with ferrous sulfate for iron deficiency anemia.  5. History of chronic obstructive pulmonary disease. He is on albuterol as needed.  6. Chronic pancreatitis, on Creon. Continue that.  7. History of hypertension. Continue Norvasc. Increase the dose to 10 mg daily as blood pressure is slightly elevated.  8. History of dementia. He is on Aricept and Namenda. Continue that.  9. Physical therapy placement. The patient's wife is leaning towards placement because he persistently is refusing to take medicines or he says he will take it and will not take it. The wife notices the pills are still  in his pocket and she has been having issues with the patient with medicine noncompliance for almost one week now and requests physical therapy and discharge planner for possible placement as well.   TIME SPENT ON HISTORY AND PHYSICAL: 60 minutes.   CODE STATUS: FULL CODE.  ____________________________ Katha HammingSnehalatha Uday Jantz, MD sk:drc D: 04/08/2011 21:03:13 ET T: 04/09/2011 07:59:55 ET JOB#: [id number]  cc: Katha HammingSnehalatha Alton Tremblay, MD, <Dictator> Glenetta BorgMaria-Dorina Sevilla, MD Katha HammingSNEHALATHA Lamesha Tibbits MD ELECTRONICALLY SIGNED 04/09/2011 16:54
